# Patient Record
Sex: Female | Born: 1975 | Race: White | Hispanic: No | Marital: Married | Smoking: Never smoker
Health system: Southern US, Community
[De-identification: ages and names within clinical notes are randomized; demographics above are authoritative.]

## PROBLEM LIST (undated history)

## (undated) DIAGNOSIS — Z800 Family history of malignant neoplasm of digestive organs: Secondary | ICD-10-CM

## (undated) DIAGNOSIS — Z808 Family history of malignant neoplasm of other organs or systems: Secondary | ICD-10-CM

## (undated) DIAGNOSIS — K219 Gastro-esophageal reflux disease without esophagitis: Secondary | ICD-10-CM

## (undated) DIAGNOSIS — Z803 Family history of malignant neoplasm of breast: Secondary | ICD-10-CM

## (undated) HISTORY — DX: Family history of malignant neoplasm of other organs or systems: Z80.8

## (undated) HISTORY — PX: WISDOM TOOTH EXTRACTION: SHX21

## (undated) HISTORY — PX: TONSILLECTOMY: SUR1361

## (undated) HISTORY — DX: Gastro-esophageal reflux disease without esophagitis: K21.9

## (undated) HISTORY — DX: Family history of malignant neoplasm of breast: Z80.3

## (undated) HISTORY — DX: Family history of malignant neoplasm of digestive organs: Z80.0

---
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
## 2015-10-24 ENCOUNTER — Encounter (HOSPITAL_BASED_OUTPATIENT_CLINIC_OR_DEPARTMENT_OTHER): Payer: Self-pay | Admitting: *Deleted

## 2015-10-24 ENCOUNTER — Emergency Department (HOSPITAL_BASED_OUTPATIENT_CLINIC_OR_DEPARTMENT_OTHER)
Admission: EM | Admit: 2015-10-24 | Discharge: 2015-10-24 | Disposition: A | Discharge status: Home / Self Care | Payer: Managed Care, Other (non HMO) | Attending: Emergency Medicine | Admitting: Emergency Medicine

## 2015-10-24 ENCOUNTER — Emergency Department (HOSPITAL_BASED_OUTPATIENT_CLINIC_OR_DEPARTMENT_OTHER): Payer: Managed Care, Other (non HMO)

## 2015-10-24 DIAGNOSIS — Y998 Other external cause status: Secondary | ICD-10-CM | POA: Diagnosis not present

## 2015-10-24 DIAGNOSIS — Z792 Long term (current) use of antibiotics: Secondary | ICD-10-CM | POA: Insufficient documentation

## 2015-10-24 DIAGNOSIS — Y9289 Other specified places as the place of occurrence of the external cause: Secondary | ICD-10-CM | POA: Insufficient documentation

## 2015-10-24 DIAGNOSIS — Y9389 Activity, other specified: Secondary | ICD-10-CM | POA: Diagnosis not present

## 2015-10-24 DIAGNOSIS — S4992XA Unspecified injury of left shoulder and upper arm, initial encounter: Secondary | ICD-10-CM

## 2015-10-24 MED ORDER — HYDROCODONE-ACETAMINOPHEN 5-325 MG PO TABS: 1.0000 | ORAL_TABLET | Freq: Once | ORAL | Status: DC

## 2015-10-24 MED ORDER — HYDROCODONE-ACETAMINOPHEN 5-325 MG PO TABS
1.0000 | ORAL_TABLET | Freq: Once | ORAL | Status: AC
  Administered 2015-10-24: 1 via ORAL
  Filled 2015-10-24: qty 1

## 2015-10-24 MED ORDER — IBUPROFEN 600 MG PO TABS: 600.0000 mg | ORAL_TABLET | Freq: Four times a day (QID) | ORAL | Status: DC | PRN

## 2015-10-24 NOTE — Discharge Instructions (Signed)
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
## 2017-06-28 ENCOUNTER — Other Ambulatory Visit: Payer: Self-pay | Admitting: Internal Medicine

## 2017-06-28 DIAGNOSIS — Z1231 Encounter for screening mammogram for malignant neoplasm of breast: Secondary | ICD-10-CM

## 2017-06-30 ENCOUNTER — Other Ambulatory Visit: Payer: Self-pay | Admitting: Internal Medicine

## 2017-06-30 DIAGNOSIS — N63 Unspecified lump in unspecified breast: Secondary | ICD-10-CM

## 2017-07-05 ENCOUNTER — Ambulatory Visit
Admission: RE | Admit: 2017-07-05 | Discharge: 2017-07-05 | Disposition: A | Discharge status: Home / Self Care | Payer: Managed Care, Other (non HMO) | Source: Ambulatory Visit | Attending: Internal Medicine | Admitting: Internal Medicine

## 2017-07-05 DIAGNOSIS — N63 Unspecified lump in unspecified breast: Secondary | ICD-10-CM

## 2018-04-16 ENCOUNTER — Encounter: Payer: Self-pay | Admitting: Gastroenterology

## 2018-06-19 ENCOUNTER — Encounter

## 2018-06-19 ENCOUNTER — Ambulatory Visit (INDEPENDENT_AMBULATORY_CARE_PROVIDER_SITE_OTHER): Payer: 59 | Admitting: Gastroenterology

## 2018-06-19 ENCOUNTER — Encounter: Payer: Self-pay | Admitting: Gastroenterology

## 2018-06-19 ENCOUNTER — Encounter (INDEPENDENT_AMBULATORY_CARE_PROVIDER_SITE_OTHER): Payer: Self-pay

## 2018-06-19 VITALS — BP 108/76 | HR 82 | Ht 69.0 in | Wt 186.0 lb

## 2018-06-19 DIAGNOSIS — R109 Unspecified abdominal pain: Secondary | ICD-10-CM | POA: Diagnosis not present

## 2018-06-19 DIAGNOSIS — R140 Abdominal distension (gaseous): Secondary | ICD-10-CM

## 2018-06-19 DIAGNOSIS — K219 Gastro-esophageal reflux disease without esophagitis: Secondary | ICD-10-CM

## 2018-06-19 DIAGNOSIS — Z800 Family history of malignant neoplasm of digestive organs: Secondary | ICD-10-CM

## 2018-06-19 DIAGNOSIS — R1012 Left upper quadrant pain: Secondary | ICD-10-CM

## 2018-06-19 DIAGNOSIS — K582 Mixed irritable bowel syndrome: Secondary | ICD-10-CM

## 2018-06-19 DIAGNOSIS — K6389 Other specified diseases of intestine: Secondary | ICD-10-CM

## 2018-06-19 MED ORDER — RIFAXIMIN 550 MG PO TABS: 550.0000 mg | ORAL_TABLET | Freq: Three times a day (TID) | ORAL | 0 refills | Status: DC

## 2018-06-19 NOTE — Patient Instructions (Addendum)
It has been recommended to you by your physician that you have a(n) Endoscopy completed. Per your request, we did not schedule the procedure(s) today. Please contact our office at [phone] should you decide to have the procedure completed.  We are giving you samples of Glycate to take three times a day as needed  We will send in a script of Xifaxian to your pharmacy to take for 10 days   Lactose-Free Diet, Adult If you have lactose intolerance, you are not able to digest lactose. Lactose is a natural sugar found mainly in milk and milk products. You may need to avoid all foods and beverages that contain lactose. A lactose-free diet can help you do this. What do I need to know about this diet?  Do not consume foods, beverages, vitamins, minerals, or medicines with lactose. Read ingredients lists carefully.  Look for the words "lactose-free" on labels.  Use lactase enzyme drops or tablets as directed by your health care provider.  Use lactose-free milk or a milk alternative, such as soy milk, for drinking and cooking.  Make sure you get enough calcium and vitamin D in your diet. A lactose-free eating plan can be lacking in these important nutrients.  Take calcium and vitamin D supplements as directed by your health care provider. Talk to your provider about supplements if you are not able to get enough calcium and vitamin D from food. Which foods have lactose? Lactose is found in:  Milk and foods made from milk.  Yogurt.  Cheese.  Butter.  Margarine.  Sour cream.  Cream.  Whipped toppings and nondairy creamers.  Ice cream and other milk-based desserts.  Lactose is also found in foods or products made with milk or milk ingredients. To find out whether a food contains milk or a milk ingredient, look at the ingredients list. Avoid foods with the statement "May contain milk" and foods that contain:  Butter.  Cream.  Milk.  Milk solids.  Milk  powder.  Whey.  Curd.  Caseinate.  Lactose.  Lactalbumin.  Lactoglobulin.  What are some alternatives to milk and foods made with milk products?  Lactose-free milk.  Soy milk with added calcium and vitamin D.  Almond, coconut, or rice milk with added calcium and vitamin D. Note that these are low in protein.  Soy products, such as soy yogurt, soy cheese, soy ice cream, and soy-based sour cream. Which foods can I eat? Grains Breads and rolls made without milk, such as [place], [place], or [place] bread, bagels, pita, and [employer]. Corn tortillas, corn meal, grits, and polenta. Crackers without lactose or milk solids, such as soda crackers and graham crackers. Cooked or dry cereals without lactose or milk solids. Pasta, quinoa, couscous, barley, oats, bulgur, farro, rice, wild rice, or other grains prepared without milk or lactose. Plain popcorn. Vegetables Fresh, frozen, and canned vegetables without cheese, cream, or butter sauces. Fruits All fresh, canned, frozen, or dried fruits that are not processed with lactose. Meats and Other Protein Sources Plain beef, chicken, fish, [place], lamb, veal, pork, wild game, or ham. Kosher-prepared meat products. Strained or junior meats that do not contain milk. Eggs. Soy meat substitutes. Beans, lentils, and hummus. Tofu. Nuts and seeds. Peanut or other nut butters without lactose. Soups, casseroles, and mixed dishes without cheese, cream, or milk. Dairy Lactose-free milk. Soy, rice, or almond milk with added calcium and vitamin D. Soy cheese and yogurt. Beverages Carbonated drinks. Tea. Coffee, freeze-dried coffee, and some instant coffees. Fruit and vegetable  juices. Condiments Soy sauce. Carob powder. Olives. Gravy made with water. Baker's cocoa. Rosita Fire. Pure seasonings and spices. Ketchup. Mustard. Bouillon. Broth. Sweets and Desserts Water and fruit ices. Gelatin. Cookies, pies, or cakes made from allowed ingredients, such as angel food  cake. Pudding made with water or a milk substitute. Lactose-free tofu desserts. Soy, coconut milk, or rice-milk-based frozen desserts. Sugar. Honey. Jam, jelly, and marmalade. Molasses. Pure sugar candy. Dark chocolate without milk. Marshmallows. Fats and Oils Margarines and salad dressings that do not contain milk. Tomasa Blase. Vegetable oils. Shortening. Mayonnaise. Soy or coconut-based cream. The items listed above may not be a complete list of recommended foods or beverages. Contact your dietitian for more options. Which foods are not recommended? Grains Breads and rolls that contain milk. Toaster pastries. Muffins, biscuits, waffles, cornbread, and pancakes. These can be prepared at home, commercial, or from mixes. Sweet rolls, donuts, English muffins, fry bread, lefse, flour tortillas with lactose, or [place] toast made with milk or milk ingredients. Crackers that contain lactose. Corn curls. Cooked or dry cereals with lactose. Vegetables Creamed or breaded vegetables. Vegetables in a cheese or butter sauce or with lactose-containing margarines. Instant potatoes. [place] fries. Scalloped or au gratin potatoes. Fruits None. Meats and Other Protein Sources Scrambled eggs, omelets, and souffles that contain milk. Creamed or breaded meat, fish, chicken, or [place]. Sausage products, such as wieners and liver sausage. Cold cuts that contain milk solids. Cheese, cottage cheese, ricotta cheese, and cheese spreads. Lasagna and macaroni and cheese. Pizza. Peanut or other nut butters with added milk solids. Casseroles or mixed dishes containing milk or cheese. Dairy All dairy products, including milk, goat's milk, buttermilk, kefir, acidophilus milk, flavored milk, evaporated milk, condensed milk, dulce de Haring, eggnog, yogurt, cheese, and cheese spreads. Beverages Hot chocolate. Cocoa with lactose. Instant iced teas. Powdered fruit drinks. Smoothies made with milk or yogurt. Condiments Chewing gum that has  lactose. Cocoa that has lactose. Spice blends if they contain milk products. Artificial sweeteners that contain lactose. Nondairy creamers. Sweets and Desserts Ice cream, ice milk, gelato, sherbet, and frozen yogurt. Custard, pudding, and mousse. Cake, cream pies, cookies, and other desserts containing milk, cream, cream cheese, or milk chocolate. Pie crust made with milk-containing margarine or butter. Reduced-calorie desserts made with a sugar substitute that contains lactose. Toffee and butterscotch. Milk, white, or dark chocolate that contains milk. Fudge. Caramel. Fats and Oils Margarines and salad dressings that contain milk or cheese. Cream. Half and half. Cream cheese. Sour cream. Chip dips made with sour cream or yogurt. The items listed above may not be a complete list of foods and beverages to avoid. Contact your dietitian for more information. Am I getting enough calcium? Calcium is found in many foods that contain lactose and is important for bone health. The amount of calcium you need depends on your age:  Adults younger than 50 years: 1000 mg of calcium a day.  Adults older than 50 years: 1200 mg of calcium a day.  If you are not getting enough calcium, other calcium sources include:  Orange juice with calcium added. There are 300-350 mg of calcium in 1 cup of orange juice.  Sardines with edible bones. There are 325 mg of calcium in 3 oz of sardines.  Calcium-fortified soy milk. There are 300-400 mg of calcium in 1 cup of calcium-fortified soy milk.  Calcium-fortified rice or almond milk. There are 300 mg of calcium in 1 cup of calcium-fortified rice or almond milk.  Canned salmon with edible  bones. There are 180 mg of calcium in 3 oz of canned salmon with edible bones.  Calcium-fortified breakfast cereals. There are [phone] mg of calcium in calcium-fortified breakfast cereals.  Tofu set with calcium sulfate. There are 250 mg of calcium in  cup of tofu set with calcium  sulfate.  Spinach, cooked. There are 145 mg of calcium in  cup of cooked spinach.  Edamame, cooked. There are 130 mg of calcium in  cup of cooked edamame.  Collard greens, cooked. There are 125 mg of calcium in  cup of cooked collard greens.  Kale, frozen or cooked. There are 90 mg of calcium in  cup of cooked or frozen kale.  Almonds. There are 95 mg of calcium in  cup of almonds.  Broccoli, cooked. There are 60 mg of calcium in 1 cup of cooked broccoli.  This information is not intended to replace advice given to you by your health care provider. Make sure you discuss any questions you have with your health care provider. Document Released: 02/18/2002 Document Revised: 02/04/2016 Document Reviewed: 11/29/2013 Elsevier Interactive Patient Education  2018 [employer].

## 2018-06-19 NOTE — Progress Notes (Signed)
Beth Winters    [id number]    July 26, 1976  Primary Care Physician:Zimmermann, Donovan Kail  Referring Physician: No referring provider defined for this encounter.  Chief complaint: GERD, alternating constipation and diarrhea HPI: 42 year old female here for new patient visit with complaints of intermittent  bloating, abd cramping, feels like baby kicking. Severe gas pain. She has alternating constipation and diarrhea Constipation with no bowel movement for 3-4 days followed by diarrhea and then normal bowel movement for some time  Recently started taking Align 1 capsule daily in the past 2 months and feels symptoms are slightly better but continues to have significant abdominal bloating 1 to 2 hours postprandial associated with significant cramping and discomfort, worse in the left upper quadrant.  No dysphagia, odynophagia, loss of appetite, melena or blood per rectum  She has intermittent mild nausea but no vomiting  Heartburn intermittently, uses pepcid as needed, heartburn is worse at bedtime On some nights she wakes up chocking and coughing  Father Gastric cancer diagnosed at age 12, passed away at age 51 Paternal aunt with breast and colon cancer, passed away at age 55 Paternal uncle passed away with esophageal cancer at age 17   Outpatient Encounter Medications as of 06/19/2018  Medication Sig  . buPROPion (WELLBUTRIN XL) 300 MG 24 hr tablet Take 300 mg by mouth daily.  . cetirizine (ZYRTEC) 5 MG tablet Take 5 mg by mouth daily.  . famotidine (PEPCID AC) 10 MG chewable tablet Chew 10 mg by mouth 2 (two) times daily.  . Probiotic Product (PROBIOTIC DAILY PO) Take by mouth.  . TAYTULLA 1-20 MG-MCG(24) CAPS Take 1 capsule by mouth daily.  . [DISCONTINUED] HYDROcodone-acetaminophen (NORCO/VICODIN) 5-325 MG tablet Take 1 tablet by mouth once.  . [DISCONTINUED] ibuprofen (ADVIL,MOTRIN) 600 MG tablet Take 1 tablet (600 mg total) by mouth every 6 (six) hours as needed  for moderate pain.  . [DISCONTINUED] levofloxacin (LEVAQUIN) 750 MG tablet Take 750 mg by mouth daily.   No facility-administered encounter medications on file as of 06/19/2018.     Allergies as of 06/19/2018 - Review Complete 06/19/2018  Allergen Reaction Noted  . Pineapple  10/24/2015    History reviewed. No pertinent past medical history.  History reviewed. No pertinent surgical history.  Family History  Problem Relation Age of Onset  . Stomach cancer Father 78  . Breast cancer Paternal Aunt   . Colon cancer Paternal Aunt   . AAA (abdominal aortic aneurysm) Paternal Aunt   . Esophageal cancer Paternal Uncle     Social History   Socioeconomic History  . Marital status: Married    Spouse name: Not on file  . Number of children: Not on file  . Years of education: Not on file  . Highest education level: Not on file  Occupational History  . Not on file  Social Needs  . Financial resource strain: Not on file  . Food insecurity:    Worry: Not on file    Inability: Not on file  . Transportation needs:    Medical: Not on file    Non-medical: Not on file  Tobacco Use  . Smoking status: Never Smoker  . Smokeless tobacco: Never Used  Substance and Sexual Activity  . Alcohol use: Yes    Comment: occ  . Drug use: No  . Sexual activity: Yes    Partners: Male    Birth control/protection: Pill  Lifestyle  . Physical activity:  Days per week: Not on file    Minutes per session: Not on file  . Stress: Not on file  Relationships  . Social connections:    Talks on phone: Not on file    Gets together: Not on file    Attends religious service: Not on file    Active member of club or organization: Not on file    Attends meetings of clubs or organizations: Not on file    Relationship status: Not on file  . Intimate partner violence:    Fear of current or ex partner: Not on file    Emotionally abused: Not on file    Physically abused: Not on file    Forced sexual  activity: Not on file  Other Topics Concern  . Not on file  Social History Narrative  . Not on file      Review of systems: Review of Systems  Constitutional: Negative for fever and chills.  HENT: Positive for allergy, sinus trouble Eyes: Negative for blurred vision.  Respiratory: Negative for cough, shortness of breath and wheezing.   Cardiovascular: Negative for chest pain and palpitations.  Gastrointestinal: as per HPI Genitourinary: Negative for dysuria, urgency, frequency and hematuria.  Musculoskeletal: Positive for myalgias, back pain and joint pain.  Skin: Negative for itching and rash.  Neurological: Negative for dizziness, tremors, focal weakness, seizures and loss of consciousness.  Endo/Heme/Allergies: Negative for seasonal allergies.  Psychiatric/Behavioral: Negative for depression, suicidal ideas and hallucinations.  Positive for insomnia All other systems reviewed and are negative.   Physical Exam: Vitals:   06/19/18 1409  BP: 108/76  Pulse: 82   Body mass index is 27.47 kg/m. Gen:      No acute distress HEENT:  EOMI, sclera anicteric Neck:     No masses; no thyromegaly Lungs:    Clear to auscultation bilaterally; normal respiratory effort CV:         Regular rate and rhythm; no murmurs Abd:      + bowel sounds; soft, non-tender; no palpable masses, no distension Ext:    No edema; adequate peripheral perfusion Skin:      Warm and dry; no rash Neuro: alert and oriented x 3 Psych: normal mood and affect  Data Reviewed:  Reviewed labs, radiology imaging, old records and pertinent past GI work up   Assessment and Plan/Recommendations:  42 year old female with alternating diarrhea with constipation, abdominal bloating, left upper quadrant discomfort, intermittent heartburn and reflux symptoms.  GERD: Continue Pepcid at bedtime as needed Antireflux measures and lifestyle modification Left upper quadrant abdominal pain with family history of gastric  cancer in father at age 83 Scheduled for EGD with biopsies to exclude H. pylori, peptic ulcer disease. The risks and benefits as well as alternatives of endoscopic procedure(s) have been discussed and reviewed. All questions answered. The patient agrees to proceed.   Abdominal bloating and cramping Possible lactose intolerance We will do a trial of lactose-free diet for 1 week  Given the associated IBS with alternating diarrhea constipation concerning for small intestinal bacterial overgrowth.  Will empirically treat with rifaximin 550 mg 3 times daily for 10 days continues to have persistent symptoms despite lactose-free diet Stop probiotic  Use glycopyrrolate 1 tablet 1.5 mg up to 3 times daily as needed for symptom  She has family history of breast cancer, gastric cancer and colon cancer on paternal side.  Will consider referral to genetic counseling if patient is interested in pursuing.    Iona Beard ,  MD [phone]    CC: No ref. provider found

## 2018-06-20 ENCOUNTER — Encounter: Payer: Self-pay | Admitting: Gastroenterology

## 2018-06-22 ENCOUNTER — Telehealth: Payer: Self-pay | Admitting: *Deleted

## 2018-06-22 DIAGNOSIS — R140 Abdominal distension (gaseous): Secondary | ICD-10-CM

## 2018-06-22 DIAGNOSIS — R1012 Left upper quadrant pain: Secondary | ICD-10-CM

## 2018-06-22 DIAGNOSIS — R109 Unspecified abdominal pain: Secondary | ICD-10-CM

## 2018-06-22 NOTE — Telephone Encounter (Signed)
Patient scheduled and instructions mailed today

## 2018-07-12 ENCOUNTER — Encounter: Payer: Self-pay | Admitting: Gastroenterology

## 2018-07-20 ENCOUNTER — Encounter: Payer: Self-pay | Admitting: Gastroenterology

## 2018-07-20 ENCOUNTER — Ambulatory Visit (AMBULATORY_SURGERY_CENTER): Payer: 59 | Admitting: Gastroenterology

## 2018-07-20 VITALS — BP 133/73 | HR 72 | Temp 97.5°F | Resp 20 | Ht 69.0 in | Wt 186.0 lb

## 2018-07-20 DIAGNOSIS — K219 Gastro-esophageal reflux disease without esophagitis: Secondary | ICD-10-CM

## 2018-07-20 DIAGNOSIS — K2970 Gastritis, unspecified, without bleeding: Secondary | ICD-10-CM

## 2018-07-20 MED ORDER — SODIUM CHLORIDE 0.9 % IV SOLN: 500.0000 mL | Freq: Once | INTRAVENOUS | Status: AC

## 2018-07-20 MED ORDER — LIDOCAINE VISCOUS HCL 2 % MT SOLN: 5.0000 mL | Freq: Four times a day (QID) | OROMUCOSAL | 0 refills | Status: DC | PRN

## 2018-07-20 MED ORDER — OMEPRAZOLE 40 MG PO CPDR
40.0000 mg | DELAYED_RELEASE_CAPSULE | Freq: Two times a day (BID) | ORAL | 3 refills | Status: DC
Start: 2018-07-20 — End: 2018-11-01

## 2018-07-20 NOTE — Progress Notes (Signed)
Called to room to assist during endoscopic procedure.  Patient ID and intended procedure confirmed with present staff. Received instructions for my participation in the procedure from the performing physician.  

## 2018-07-20 NOTE — Patient Instructions (Signed)
YOU HAD AN ENDOSCOPIC PROCEDURE TODAY AT THE  ENDOSCOPY CENTER:   Refer to the procedure report that was given to you for any specific questions about what was found during the examination.  If the procedure report does not answer your questions, please call your gastroenterologist to clarify.  If you requested that your care partner not be given the details of your procedure findings, then the procedure report has been included in a sealed envelope for you to review at your convenience later.  YOU SHOULD EXPECT: Some feelings of bloating in the abdomen. Passage of more gas than usual.  Walking can help get rid of the air that was put into your GI tract during the procedure and reduce the bloating. If you had a lower endoscopy (such as a colonoscopy or flexible sigmoidoscopy) you may notice spotting of blood in your stool or on the toilet paper. If you underwent a bowel prep for your procedure, you may not have a normal bowel movement for a few days.  Please Note:  You might notice some irritation and congestion in your nose or some drainage.  This is from the oxygen used during your procedure.  There is no need for concern and it should clear up in a day or so.  SYMPTOMS TO REPORT IMMEDIATELY:   Following upper endoscopy (EGD)  Vomiting of blood or coffee ground material  New chest pain or pain under the shoulder blades  Painful or persistently difficult swallowing  New shortness of breath  Fever of 100F or higher  Black, tarry-looking stools  For urgent or emergent issues, a gastroenterologist can be reached at any hour by calling [phone].   DIET:  We do recommend a small meal at first, but then you may proceed to your regular diet. Eat small frequent meals. Drink plenty of fluids but you should avoid alcoholic beverages for 24 hours.  MEDICATIONS: Use Prilosec (omeprazole) 40 mg by mouth twice daily for 2 months followed by once daily. Use Viscous Lidocaine 2 % 5 cc every 6  hours as needed.  Please see handouts given to you by your recovery nurse.  FOLLOW UP: Return to see Dr. Lavon Paganini in her office in 2 months.  ACTIVITY:  You should plan to take it easy for the rest of today and you should NOT DRIVE or use heavy machinery until tomorrow (because of the sedation medicines used during the test).    FOLLOW UP: Our staff will call the number listed on your records the next business day following your procedure to check on you and address any questions or concerns that you may have regarding the information given to you following your procedure. If we do not reach you, we will leave a message.  However, if you are feeling well and you are not experiencing any problems, there is no need to return our call.  We will assume that you have returned to your regular daily activities without incident.  If any biopsies were taken you will be contacted by phone or by letter within the next 1-3 weeks.  Please call us at [phone] if you have not heard about the biopsies in 3 weeks.    SIGNATURES/CONFIDENTIALITY: You and/or your care partner have signed paperwork which will be entered into your electronic medical record.  These signatures attest to the fact that that the information above on your After Visit Summary has been reviewed and is understood.  Full responsibility of the confidentiality of this discharge information  lies with you and/or your care-partner.

## 2018-07-20 NOTE — Progress Notes (Signed)
Report given to PACU, vss 

## 2018-07-20 NOTE — Op Note (Signed)
[facility] Patient Name: Beth Winters Procedure Date: 07/20/2018 1:58 PM MRN: [id number] Endoscopist: Napoleon Form , MD Age: 42 Referring MD:  Date of Birth: 1975-09-29 Gender: Female Account #: [id number] Procedure:                Upper GI endoscopy Indications:              Epigastric abdominal pain, Dyspepsia Medicines:                Monitored Anesthesia Care Procedure:                Pre-Anesthesia Assessment:                           - Prior to the procedure, a History and Physical                            was performed, and patient medications and                            allergies were reviewed. The patient's tolerance of                            previous anesthesia was also reviewed. The risks                            and benefits of the procedure and the sedation                            options and risks were discussed with the patient.                            All questions were answered, and informed consent                            was obtained. Prior Anticoagulants: The patient has                            taken no previous anticoagulant or antiplatelet                            agents. ASA Grade Assessment: I - A normal, healthy                            patient. After reviewing the risks and benefits,                            the patient was deemed in satisfactory condition to                            undergo the procedure.                           After obtaining informed consent, the endoscope was  passed under direct vision. Throughout the                            procedure, the patient's blood pressure, pulse, and                            oxygen saturations were monitored continuously. The                            Endoscope was introduced through the mouth, and                            advanced to the second part of duodenum. The upper                            GI endoscopy was  accomplished without difficulty.                            The patient tolerated the procedure well. Scope In: Scope Out: Findings:                 Mucosal changes including longitudinal furrows,                            circumferential folds, congestion (edema) and crepe                            paper esophagus were found in the proximal                            esophagus and in the mid esophagus. Esophageal                            findings were graded using the Eosinophilic                            Esophagitis Endoscopic Reference Score (EoE-EREFS)                            as: Edema Grade 1 Present (decreased clarity or                            absence of vascular markings), Rings Grade 0 None                            (no ridges or rings seen), Exudates Grade 0 None                            (no white lesions seen), Furrows Grade 1 Present                            (vertical lines with or without visible depth) and  Stricture none (no stricture found). Biopsies were                            obtained from the proximal esophagus with cold                            forceps for histology of suspected eosinophilic                            esophagitis.                           The Z-line was regular and was found 36 cm from the                            incisors.                           LA Grade B (one or more mucosal breaks greater than                            5 mm, not extending between the tops of two mucosal                            folds) esophagitis was found 35 to 36 cm from the                            incisors.                           Retained fluid (~small volume) with pills was found                            in the gastric body.                           Patchy mild inflammation characterized by                            congestion (edema) and erythema was found in the                            stomach. Biopsies  were taken with a cold forceps                            for Helicobacter pylori testing.                           The examined duodenum was normal. Complications:            No immediate complications. Estimated Blood Loss:     Estimated blood loss was minimal. Impression:               - Esophageal mucosal changes suspicious for  eosinophilic esophagitis. Biopsied.                           - Z-line regular, 36 cm from the incisors.                           - LA Grade B reflux esophagitis.                           - Retained gastric fluid.                           - Gastritis. Biopsied.                           - Normal examined duodenum. Recommendation:           - Patient has a contact number available for                            emergencies. The signs and symptoms of potential                            delayed complications were discussed with the                            patient. Return to normal activities tomorrow.                            Written discharge instructions were provided to the                            patient.                           - Resume previous diet.                           - Continue present medications.                           - Use Prilosec (omeprazole) 40 mg PO BID for 2                            months followed by once daily.                           - Follow an antireflux regimen indefinitely.                           - Small frequent meals                           - Return to my office in 2 months. Napoleon Form, MD 07/20/2018 2:29:34 PM This report has been signed electronically.

## 2018-07-23 ENCOUNTER — Telehealth: Payer: Self-pay | Admitting: *Deleted

## 2018-07-23 NOTE — Telephone Encounter (Signed)
  Follow up Call-  Call back number 07/20/2018  Post procedure Call Back phone  # [phone]  Permission to leave phone message Yes  Some recent data might be hidden     Patient questions:  Do you have a fever, pain , or abdominal swelling? No. Pain Score  0 *  Have you tolerated food without any problems? Yes.    Have you been able to return to your normal activities? Yes.    Do you have any questions about your discharge instructions: Diet   No. Medications  No. Follow up visit  No.  Do you have questions or concerns about your Care? No.  Actions: * If pain score is 4 or above: No action needed, pain <4.

## 2018-07-25 ENCOUNTER — Encounter: Payer: Self-pay | Admitting: Gastroenterology

## 2018-08-01 ENCOUNTER — Other Ambulatory Visit: Payer: Self-pay | Admitting: Internal Medicine

## 2018-08-01 DIAGNOSIS — Z1231 Encounter for screening mammogram for malignant neoplasm of breast: Secondary | ICD-10-CM

## 2018-09-13 ENCOUNTER — Ambulatory Visit
Admission: RE | Admit: 2018-09-13 | Discharge: 2018-09-13 | Disposition: A | Discharge status: Home / Self Care | Payer: 59 | Source: Ambulatory Visit | Attending: Internal Medicine | Admitting: Internal Medicine

## 2018-09-13 DIAGNOSIS — Z1231 Encounter for screening mammogram for malignant neoplasm of breast: Secondary | ICD-10-CM

## 2018-10-22 ENCOUNTER — Encounter: Payer: Self-pay | Admitting: Gastroenterology

## 2018-11-01 ENCOUNTER — Telehealth: Payer: Self-pay | Admitting: Gastroenterology

## 2018-11-01 MED ORDER — OMEPRAZOLE 40 MG PO CPDR: 40.0000 mg | DELAYED_RELEASE_CAPSULE | Freq: Two times a day (BID) | ORAL | 3 refills | Status: DC

## 2018-11-01 NOTE — Telephone Encounter (Signed)
Omeprazole sent to [facility] as requested

## 2018-11-01 NOTE — Telephone Encounter (Signed)
Beth Winters from [facility] home delivery [address] [place] [address]  [phone]) she is needing a renewal for the mm for the omeprazole (PRILOSEC) 40 MG capsule [[id number]]

## 2019-04-26 ENCOUNTER — Other Ambulatory Visit: Payer: Self-pay

## 2019-04-26 MED ORDER — GLYCOPYRROLATE 1 MG PO TABS: 1.0000 mg | ORAL_TABLET | Freq: Three times a day (TID) | ORAL | 0 refills | Status: AC | PRN

## 2019-05-14 ENCOUNTER — Encounter: Payer: Self-pay | Admitting: Gastroenterology

## 2019-05-14 ENCOUNTER — Ambulatory Visit (INDEPENDENT_AMBULATORY_CARE_PROVIDER_SITE_OTHER): Payer: 59 | Admitting: Gastroenterology

## 2019-05-14 VITALS — BP 110/80 | HR 94 | Temp 98.8°F | Ht 69.0 in | Wt 198.0 lb

## 2019-05-14 DIAGNOSIS — K219 Gastro-esophageal reflux disease without esophagitis: Secondary | ICD-10-CM

## 2019-05-14 DIAGNOSIS — R140 Abdominal distension (gaseous): Secondary | ICD-10-CM

## 2019-05-14 DIAGNOSIS — K5909 Other constipation: Secondary | ICD-10-CM | POA: Diagnosis not present

## 2019-05-14 MED ORDER — PRILOSEC 40 MG PO CPDR: 40.0000 mg | DELAYED_RELEASE_CAPSULE | Freq: Every day | ORAL | 3 refills | Status: DC

## 2019-05-14 NOTE — Patient Instructions (Addendum)
We have sent Prilosec to your pharmacy  Take Benefiber 1 teaspoon twice daily  Take Miralax 1/2 capful twice daily  Dr Silverio Decamp recommends that you complete a bowel purge (to clean out your bowels). Please do the following: Purchase a bottle of Miralax over the counter as well as a box of 5 mg dulcolax tablets. Take 4 dulcolax tablets. Wait 1 hour. You will then drink 6-8 capfuls of Miralax mixed in an adequate amount of water/juice/gatorade (you may choose which of these liquids to drink) over the next 2-3 hours. You should expect results within 1 to 6 hours after completing the bowel purge.  I appreciate the  opportunity to care for you  Thank You   Harl Bowie , MD

## 2019-05-14 NOTE — Progress Notes (Addendum)
Beth Winters    [id number]    07/13/1976  Primary Care Physician:Zimmermann, Donovan KailMatt, PA-C  Referring Physician: Salli QuarryZimmermann, [name], PA-C [address] [place],  [place] [id number]   Chief complaint:  GERD, constipation, bloating  HPI: 43 year old female here for follow-up visit She has intermittent abdominal bloating associated with decreased appetite.  She is taking MiraLAX capful as needed and has on average 3-4 bowel movements per week.  Denies any melena or blood per rectum. She feels full even after a small meal.  Denies any weight loss She didn't tolerate generic Omeprazole, didn't seem to work well.  EGD 07/20/2018 LA grade B esophagitis. Esophageal biopsies negative for EOE Mild gastritis.     Outpatient Encounter Medications as of 05/14/2019  Medication Sig  . Ascorbic Acid (VITAMIN C PO) Take by mouth.  Marland Kitchen. buPROPion (WELLBUTRIN XL) 300 MG 24 hr tablet Take 300 mg by mouth daily.  . cetirizine (ZYRTEC) 5 MG tablet Take 5 mg by mouth daily.  . famotidine (PEPCID AC) 10 MG chewable tablet Chew 10 mg by mouth 2 (two) times daily.  Marland Kitchen. glycopyrrolate (ROBINUL) 1 MG tablet Take 1 tablet (1 mg total) by mouth 3 (three) times daily as needed (intestinal cramps and gas pain).  Marland Kitchen. lidocaine (XYLOCAINE) 2 % solution Use as directed 5 mLs in the mouth or throat every 6 (six) hours as needed for mouth pain.  Marland Kitchen. omeprazole (PRILOSEC) 40 MG capsule Take 1 capsule (40 mg total) by mouth 2 (two) times daily.  . Probiotic Product (PROBIOTIC DAILY PO) Take by mouth.  . rifaximin (XIFAXAN) 550 MG TABS tablet Take 1 tablet (550 mg total) by mouth 3 (three) times daily.  . TAYTULLA 1-20 MG-MCG(24) CAPS Take 1 capsule by mouth daily.  Marland Kitchen. VITAMIN D PO Take by mouth.   Facility-Administered Encounter Medications as of 05/14/2019  Medication  . 0.9 %  sodium chloride infusion    Allergies as of 05/14/2019 - Review Complete 07/20/2018  Allergen Reaction Noted  . Pineapple Nausea And  Vomiting 10/24/2015    Past Medical History:  Diagnosis Date  . GERD (gastroesophageal reflux disease)     Past Surgical History:  Procedure Laterality Date  . LYMPH NODE DISSECTION  1993   bx's done first-enlarged so they removed this.  . TONSILLECTOMY    . WISDOM TOOTH EXTRACTION      Family History  Problem Relation Age of Onset  . Stomach cancer Father [id number]  . Breast cancer Paternal Aunt   . Colon cancer Paternal Aunt   . AAA (abdominal aortic aneurysm) Paternal Aunt   . Esophageal cancer Paternal Uncle   . Hypertrophic cardiomyopathy Mother   . Hypertrophic cardiomyopathy Sister   . Hypertrophic cardiomyopathy Maternal Uncle   . Colon polyps Neg Hx     Social History   Socioeconomic History  . Marital status: Married    Spouse name: Not on file  . Number of children: Not on file  . Years of education: Not on file  . Highest education level: Not on file  Occupational History  . Not on file  Social Needs  . Financial resource strain: Not on file  . Food insecurity    Worry: Not on file    Inability: Not on file  . Transportation needs    Medical: Not on file    Non-medical: Not on file  Tobacco Use  . Smoking status: Never Smoker  . Smokeless tobacco: Never Used  Substance and Sexual Activity  . Alcohol use: Yes    Comment: 2-3 drinks per month per pt  . Drug use: No  . Sexual activity: Yes    Partners: Male    Birth control/protection: Pill  Lifestyle  . Physical activity    Days per week: Not on file    Minutes per session: Not on file  . Stress: Not on file  Relationships  . Social Herbalist on phone: Not on file    Gets together: Not on file    Attends religious service: Not on file    Active member of club or organization: Not on file    Attends meetings of clubs or organizations: Not on file    Relationship status: Not on file  . Intimate partner violence    Fear of current or ex partner: Not on file    Emotionally abused: Not  on file    Physically abused: Not on file    Forced sexual activity: Not on file  Other Topics Concern  . Not on file  Social History Narrative  . Not on file      Review of systems: Review of Systems  Constitutional: Negative for fever and chills.  HENT: Positive for sinus problem Eyes: Negative for blurred vision.  Respiratory: Negative for cough, shortness of breath and wheezing.   Cardiovascular: Negative for chest pain and palpitations.  Gastrointestinal: as per HPI Genitourinary: Negative for dysuria and hematuria.  Positive for incomplete evacuation, urgency and frequency Musculoskeletal: Negative for myalgias, back pain and joint pain.  Skin: Negative for itching and rash.  Neurological: Negative for dizziness, tremors, focal weakness, seizures and loss of consciousness.  Endo/Heme/Allergies: Positive for seasonal allergies.  Psychiatric/Behavioral: Negative for depression, suicidal ideas and hallucinations.  All other systems reviewed and are negative.   Physical Exam: Vitals:   05/14/19 0939  BP: 110/80  Pulse: 94  Temp: 98.8 F (37.1 C)   Body mass index is 29.24 kg/m. Gen:      No acute distress HEENT:  EOMI, sclera anicteric Neck:     No masses; no thyromegaly Lungs:    Clear to auscultation bilaterally; normal respiratory effort CV:         Regular rate and rhythm; no murmurs Abd:      + bowel sounds; soft, non-tender; no palpable masses, no distension Ext:    No edema; adequate peripheral perfusion Skin:      Warm and dry; no rash Neuro: alert and oriented x 3 Psych: normal mood and affect  Data Reviewed:  Reviewed labs, radiology imaging, old records and pertinent past GI work up   Assessment and Plan/Recommendations:  43 year old female with history of GERD, abdominal bloating, fullness, decreased appetite and worsening constipation  Prilosec 40 mg twice daily Discussed antireflux measures and lifestyle modification in detail  Change in  bowel habits with worsening constipation Plan for for bowel purge with MiraLAX Start Benefiber 1 teaspoon twice daily with meals MiraLAX half capful twice daily, titrate based on response with goal 1-2 soft bowel movements daily If continues to have persistent constipation, will consider colonoscopy for further evaluation +/-anorectal manometry to exclude dyssynergic defecation Return in 3 months or sooner if needed  25 minutes was spent face-to-face with the patient. Greater than 50% of the time used for counseling as well as treatment plan and follow-up. She had multiple questions which were answered to her satisfaction  K. Denzil Magnuson , MD    CC:  Salli Quarry, PA-C

## 2019-05-24 ENCOUNTER — Encounter: Payer: Self-pay | Admitting: Gastroenterology

## 2019-05-24 ENCOUNTER — Other Ambulatory Visit: Payer: Self-pay

## 2019-05-24 MED ORDER — OMEPRAZOLE 20 MG PO CPDR: 40.0000 mg | DELAYED_RELEASE_CAPSULE | Freq: Two times a day (BID) | ORAL | 3 refills | Status: DC

## 2019-09-25 ENCOUNTER — Other Ambulatory Visit: Payer: Self-pay | Admitting: Internal Medicine

## 2019-09-25 DIAGNOSIS — Z1231 Encounter for screening mammogram for malignant neoplasm of breast: Secondary | ICD-10-CM

## 2019-11-05 ENCOUNTER — Other Ambulatory Visit: Payer: Self-pay

## 2019-11-05 ENCOUNTER — Ambulatory Visit
Admission: RE | Admit: 2019-11-05 | Discharge: 2019-11-05 | Disposition: A | Discharge status: Home / Self Care | Payer: Managed Care, Other (non HMO) | Source: Ambulatory Visit | Attending: Internal Medicine | Admitting: Internal Medicine

## 2019-11-05 DIAGNOSIS — Z1231 Encounter for screening mammogram for malignant neoplasm of breast: Secondary | ICD-10-CM

## 2020-04-03 ENCOUNTER — Other Ambulatory Visit: Payer: Self-pay | Admitting: Internal Medicine

## 2020-04-03 DIAGNOSIS — N182 Chronic kidney disease, stage 2 (mild): Secondary | ICD-10-CM

## 2020-04-09 ENCOUNTER — Ambulatory Visit
Admission: RE | Admit: 2020-04-09 | Discharge: 2020-04-09 | Disposition: A | Discharge status: Home / Self Care | Payer: 59 | Source: Ambulatory Visit | Attending: Internal Medicine | Admitting: Internal Medicine

## 2020-04-09 DIAGNOSIS — N182 Chronic kidney disease, stage 2 (mild): Secondary | ICD-10-CM

## 2020-05-18 ENCOUNTER — Other Ambulatory Visit: Payer: Self-pay | Admitting: Gastroenterology

## 2020-09-14 ENCOUNTER — Telehealth: Payer: Self-pay | Admitting: Genetic Counselor

## 2020-09-14 NOTE — Telephone Encounter (Signed)
Received a genetic counseling referral from Dr. Loreta Ave family history of malignant neoplasm of digestive organs. I returned Beth Winters's call and scheduled her for a mychart video visit on 1/20 at 9am. Pt has an active mychart acct and aware to log in 10 minutes prior to ensure her camera and audio works.

## 2020-10-01 ENCOUNTER — Inpatient Hospital Stay: Payer: 59 | Attending: Genetic Counselor | Admitting: Licensed Clinical Social Worker

## 2020-10-01 ENCOUNTER — Encounter: Payer: Self-pay | Admitting: Licensed Clinical Social Worker

## 2020-10-01 DIAGNOSIS — Z808 Family history of malignant neoplasm of other organs or systems: Secondary | ICD-10-CM | POA: Insufficient documentation

## 2020-10-01 DIAGNOSIS — Z803 Family history of malignant neoplasm of breast: Secondary | ICD-10-CM | POA: Diagnosis not present

## 2020-10-01 DIAGNOSIS — Z800 Family history of malignant neoplasm of digestive organs: Secondary | ICD-10-CM | POA: Insufficient documentation

## 2020-10-01 NOTE — Progress Notes (Signed)
REFERRING PROVIDER: Charna Elizabeth, MD [address] [place],  [place] [address]   PRIMARY PROVIDER:  Salli Quarry, PA-C  PRIMARY REASON FOR VISIT:  1. Family history of stomach cancer   2. Family history of breast cancer   3. Family history of colon cancer   4. Family history of adrenal cancer    I connected with Beth Winters on 10/01/2020 at 8:55 AM EDT by MyChart video conference and verified that I am speaking with the correct person using two identifiers.    Patient location: home Provider location: [facility]   HISTORY OF PRESENT ILLNESS:   [name], a 45 y.o. female, was seen for a [place] cancer genetics consultation at the request of Dr.Mann due to a family history of cancer  Beth Winters presents to clinic today to discuss the possibility of a hereditary predisposition to cancer, genetic testing, and to further clarify her future cancer risks, as well as potential cancer risks for family members.   Beth Winters is a 45 y.o. female with no personal history of cancer.    CANCER HISTORY:  Oncology History   No history exists.     RISK FACTORS:  Menarche was at age 28.  First live birth at age 36.  OCP use for approximately 15 years.  Ovaries intact: yes.  Hysterectomy: no.  Menopausal status: premenopausal.  HRT use: 0 years. Colonoscopy: no; not examined. Mammogram within the last year: yes. Number of breast biopsies: 0. Up to date with pelvic exams: yes.   Past Medical History:  Diagnosis Date  . Family history of adrenal cancer   . Family history of breast cancer   . Family history of colon cancer   . Family history of stomach cancer   . GERD (gastroesophageal reflux disease)     Past Surgical History:  Procedure Laterality Date  . LYMPH NODE DISSECTION  1993   bx's done first-enlarged so they removed this.  . TONSILLECTOMY    . WISDOM TOOTH EXTRACTION      Social History   Socioeconomic History  . Marital status: Married     Spouse name: Not on file  . Number of children: Not on file  . Years of education: Not on file  . Highest education level: Not on file  Occupational History  . Not on file  Tobacco Use  . Smoking status: Never Smoker  . Smokeless tobacco: Never Used  Vaping Use  . Vaping Use: Never used  Substance and Sexual Activity  . Alcohol use: Yes    Comment: 2-3 drinks per month per pt  . Drug use: No  . Sexual activity: Yes    Partners: Male    Birth control/protection: Pill  Other Topics Concern  . Not on file  Social History Narrative  . Not on file   Social Determinants of Health   Financial Resource Strain: Not on file  Food Insecurity: Not on file  Transportation Needs: Not on file  Physical Activity: Not on file  Stress: Not on file  Social Connections: Not on file     FAMILY HISTORY:  We obtained a detailed, 4-generation family history.  Significant diagnoses are listed below: Family History  Problem Relation Age of Onset  . Stomach cancer Father 2  . Breast cancer Paternal Aunt 78  . Colon cancer Paternal Aunt 64  . AAA (abdominal aortic aneurysm) Paternal Aunt   . Other Paternal Aunt 38       adrenal  cancer  . Esophageal cancer Paternal Uncle        or lung cancer  . Hypertrophic cardiomyopathy Mother   . Hypertrophic cardiomyopathy Sister   . Hypertrophic cardiomyopathy Maternal Uncle   . Colon polyps Neg Hx    Beth Winters has 1 full brother and sister, they are twins. She has two maternal half sisters and 1 paternal half brother, none have had cancer. She has 2 daughters ages 65 and 76.  Beth Winters mother died at 42, no history of cancer. Patient had 2 maternal uncles and 1 aunt, her aunt had some skin cancers removed. No known cancers in maternal cousins. Maternal grandmother died at 14, grandfather died at 36. There is a history of hypertrophic cardiomyopathy on this side of the family that patient's sister is having genetics work up for.   Beth Winters father  died at 29. He had stomach cancer diagnosed at 31 and then liver cancer at 28. Beth Winters had 2 paternal uncles and 1 aunt. One uncle had either esophageal or lung or throat cancer, he did have history of smoking. Her aunt had breast and colon diagnosed in her early 59s and adrenal cancer shortly after as well. No known cancers in paternal cousins. Paternal grandmother died in her 50s/60s of heart issues, grandfather died in his 24s.   Beth Winters is unaware of previous family history of genetic testing for hereditary cancer risks.     GENETIC COUNSELING ASSESSMENT: Beth Winters is a 45 y.o. female with a family history of stomach/colon cancer which is somewhat suggestive of a hereditary cancer syndrome and predisposition to cancer. We, therefore, discussed and recommended the following at today's visit.   DISCUSSION: We discussed that approximately 5-10% of cancer is hereditary.  While she does not quite meet criteria for genetic testing, her family history is somewhat suggestive of Lynch syndrome which we discussed in detail. We discussed that testing is beneficial for several reasons including  knowing about other cancer risks, identifying potential screening and risk-reduction options that may be appropriate, and to understand if other family members could be at risk for cancer and allow them to undergo genetic testing.   We reviewed the characteristics, features and inheritance patterns of hereditary cancer syndromes. We also discussed genetic testing, including the appropriate family members to test, the process of testing, insurance coverage and turn-around-time for results. We discussed the implications of a negative, positive and/or variant of uncertain significant result. We recommended Beth Winters pursue genetic testing for the [facility] CancerNext-Expanded gene panel.   The CancerNext-Expanded + RNAinsight gene panel offered by [employer] and includes sequencing and rearrangement analysis for the  following 77 genes: IP, ALK, APC*, ATM*, AXIN2, BAP1, BARD1, BLM, BMPR1A, BRCA1*, BRCA2*, BRIP1*, CDC73, CDH1*,CDK4, CDKN1B, CDKN2A, CHEK2*, CTNNA1, DICER1, FANCC, FH, FLCN, GALNT12, KIF1B, LZTR1, MAX, MEN1, MET, MLH1*, MSH2*, MSH3, MSH6*, MUTYH*, NBN, NF1*, NF2, NTHL1, PALB2*, PHOX2B, PMS2*, POT1, PRKAR1A, PTCH1, PTEN*, RAD51C*, RAD51D*,RB1, RECQL, RET, SDHA, SDHAF2, SDHB, SDHC, SDHD, SMAD4, SMARCA4, SMARCB1, SMARCE1, STK11, SUFU, TMEM127, TP53*,TSC1, TSC2, VHL and XRCC2 (sequencing and deletion/duplication); EGFR, EGLN1, HOXB13, KIT, MITF, PDGFRA, POLD1 and POLE (sequencing only); EPCAM and GREM1 (deletion/duplication only).   Based on Beth Winters's family history of cancer, she does not meet medical criteria for genetic testing and she will pay the $250 self-pay price.   PLAN: After considering the risks, benefits, and limitations, Beth Winters provided informed consent to pursue genetic testing. A saliva kit was mailed to her and the sample will be  sent to [facility] for analysis of the CancerNext-Expanded panel. Results should be available within approximately 2-3 weeks' time, at which point they will be disclosed by telephone to Beth Winters, as will any additional recommendations warranted by these results. Beth Winters will receive a summary of her genetic counseling visit and a copy of her results once available. This information will also be available in Epic.   Beth Winters questions were answered to her satisfaction today. Our contact information was provided should additional questions or concerns arise. Thank you for the referral and allowing [place] to share in the care of your patient.   Faith Rogue, MS, [facility] Genetic Counselor [email or web address] Phone: [phone]  The patient was seen for a total of 30 minutes in virtual genetic counseling.  Dr. Grayland Ormond was available for discussion regarding this case.   _______________________________________________________________________ For  Office Staff:  Number of people involved in session: 1 Was an Intern/ student involved with case: no

## 2020-11-09 ENCOUNTER — Telehealth: Payer: Self-pay | Admitting: Licensed Clinical Social Worker

## 2020-11-16 ENCOUNTER — Ambulatory Visit: Payer: Self-pay | Admitting: Licensed Clinical Social Worker

## 2020-11-16 NOTE — Progress Notes (Signed)
error 

## 2020-11-26 NOTE — Telephone Encounter (Signed)
Attempted to contact [facility] with genetic test results by phone and by email with no response.

## 2020-12-01 ENCOUNTER — Ambulatory Visit: Payer: Self-pay | Admitting: Licensed Clinical Social Worker

## 2020-12-01 ENCOUNTER — Encounter: Payer: Self-pay | Admitting: Licensed Clinical Social Worker

## 2020-12-01 DIAGNOSIS — Z800 Family history of malignant neoplasm of digestive organs: Secondary | ICD-10-CM

## 2020-12-01 DIAGNOSIS — Z1379 Encounter for other screening for genetic and chromosomal anomalies: Secondary | ICD-10-CM

## 2020-12-01 DIAGNOSIS — Z808 Family history of malignant neoplasm of other organs or systems: Secondary | ICD-10-CM

## 2020-12-01 DIAGNOSIS — Z803 Family history of malignant neoplasm of breast: Secondary | ICD-10-CM

## 2020-12-01 NOTE — Progress Notes (Signed)
HPI:  [name] was previously seen in the [facility] due to a family history of cancer and concerns regarding a hereditary predisposition to cancer. Please refer to our prior cancer genetics clinic note for more information regarding our discussion, assessment and recommendations, at the time. [name] recent genetic test results were disclosed to her, as were recommendations warranted by these results. These results and recommendations are discussed in more detail below.  CANCER HISTORY:  Oncology History   No history exists.    FAMILY HISTORY:  We obtained a detailed, 4-generation family history.  Significant diagnoses are listed below: Family History  Problem Relation Age of Onset  . Stomach cancer Father 75  . Breast cancer Paternal Aunt 32  . Colon cancer Paternal Aunt 52  . AAA (abdominal aortic aneurysm) Paternal Aunt   . Other Paternal Aunt 31       adrenal cancer  . Esophageal cancer Paternal Uncle        or lung cancer  . Hypertrophic cardiomyopathy Mother   . Hypertrophic cardiomyopathy Sister   . Hypertrophic cardiomyopathy Maternal Uncle   . Colon polyps Neg Hx    [name] has 1 full brother and sister, they are twins. She has two maternal half sisters and 1 paternal half brother, none have had cancer. She has 2 daughters ages 74 and 57.  [name] mother died at 48, no history of cancer. Patient had 2 maternal uncles and 1 aunt, her aunt had some skin cancers removed. No known cancers in maternal cousins. Maternal grandmother died at 74, grandfather died at 22. There is a history of hypertrophic cardiomyopathy on this side of the family that patient's sister is having genetics work up for.   [name] father died at 59. He had stomach cancer diagnosed at 52 and then liver cancer at 45. [name] had 2 paternal uncles and 1 aunt. One uncle had either esophageal or lung or throat cancer, he did have history of smoking. Her aunt had breast and colon  diagnosed in her early 35s and adrenal cancer shortly after as well. No known cancers in paternal cousins. Paternal grandmother died in her 50s/60s of heart issues, grandfather died in his 74s.   [name] is unaware of previous family history of genetic testing for hereditary cancer risks.      GENETIC TEST RESULTS: Genetic testing reported out on 11/03/2020 through the [facility]Exp[facility] cancer panel found no pathogenic mutations.    The test report has been scanned into EPIC and is located under the Molecular Pathology section of the Results Review tab.  A portion of the result report is included below for reference.     We discussed with [name] that because current genetic testing is not perfect, it is possible there may be a gene mutation in one of these genes that current testing cannot detect, but that chance is small.  We also discussed, that there could be another gene that has not yet been discovered, or that we have not yet tested, that is responsible for the cancer diagnoses in the family. It is also possible there is a hereditary cause for the cancer in the family that [name] did not inherit and therefore was not identified in her testing.  Therefore, it is important to remain in touch with cancer genetics in the future so that we can continue to offer [name] the most up to date genetic testing.    ADDITIONAL GENETIC  TESTING: We discussed with [name] that her genetic testing was fairly extensive.  If there are genes identified to increase cancer risk that can be analyzed in the future, we would be happy to discuss and coordinate this testing at that time.    CANCER SCREENING RECOMMENDATIONS: [name] test result is considered negative (normal).  This means that we have not identified a hereditary cause for her  family history of cancer at this time.   While reassuring, this does not definitively rule out a hereditary predisposition to cancer. It is still possible  that there could be genetic mutations that are undetectable by current technology. There could be genetic mutations in genes that have not been tested or identified to increase cancer risk.  Therefore, it is recommended she continue to follow the cancer management and screening guidelines provided by her  primary healthcare provider.   An individual's cancer risk and medical management are not determined by genetic test results alone. Overall cancer risk assessment incorporates additional factors, including personal medical history, family history, and any available genetic information that may result in a personalized plan for cancer prevention and surveillance.  Based on [name]'s personal and family history of cancer as well as her genetic test results, risk model Rockne Beth Winters was used to estimate her risk of developing breast cancer. This estimates her lifetime risk of developing breast cancer to be approximately 27%.  The patient's lifetime breast cancer risk is a preliminary estimate based on available information using one of several models endorsed by the American Cancer Society (ACS). The ACS recommends consideration of breast MRI screening as an adjunct to mammography for patients at high risk (defined as 20% or greater lifetime risk).      [name] has been determined to be at high risk for breast cancer.  We, therefore, discussed that it is reasonable for [name] to be followed by a high-risk breast cancer clinic; in addition to a yearly mammogram and physical exam by a healthcare provider, she should discuss the usefulness of an annual breast MRI with the high-risk clinic providers. A referral will be placed to the high risk breast clinic.  RECOMMENDATIONS FOR FAMILY MEMBERS:  Relatives in this family might be at some increased risk of developing cancer, over the general population risk, simply due to the family history of cancer.  We recommended female relatives in this family have a yearly  mammogram beginning at age 72, or 31 years younger than the earliest onset of cancer, an annual clinical breast exam, and perform monthly breast self-exams. Female relatives in this family should also have a gynecological exam as recommended by their primary provider.  All family members should be referred for colonoscopy starting at age 76.   FOLLOW-UP: Lastly, we discussed with Ms. Addison that cancer genetics is a rapidly advancing field and it is possible that new genetic tests will be appropriate for her and/or her family members in the future. We encouraged her to remain in contact with cancer genetics on an annual basis so we can update her personal and family histories and let her know of advances in cancer genetics that may benefit this family.   Our contact number was provided. [name] questions were answered to her satisfaction, and she knows she is welcome to call us at anytime with additional questions or concerns.   Beth Duverney, MS, [facility] Genetic Counselor [email or web address] Phone: [phone]

## 2020-12-03 ENCOUNTER — Telehealth: Payer: Self-pay | Admitting: Hematology and Oncology

## 2020-12-03 NOTE — Telephone Encounter (Signed)
I returned Ms. Chuong's call to schedule her for the high risk clinic w/Dr. Al Pimple on 3/30 at 140pm. Pt aware to arrive 20 minutes early.

## 2020-12-09 ENCOUNTER — Other Ambulatory Visit: Payer: Self-pay

## 2020-12-09 ENCOUNTER — Encounter: Payer: Self-pay | Admitting: Hematology and Oncology

## 2020-12-09 ENCOUNTER — Inpatient Hospital Stay: Payer: 59 | Attending: Hematology and Oncology | Admitting: Hematology and Oncology

## 2020-12-09 VITALS — BP 124/71 | HR 79 | Temp 97.9°F | Resp 14 | Ht 69.0 in | Wt 193.9 lb

## 2020-12-09 DIAGNOSIS — Z808 Family history of malignant neoplasm of other organs or systems: Secondary | ICD-10-CM | POA: Diagnosis not present

## 2020-12-09 DIAGNOSIS — Z800 Family history of malignant neoplasm of digestive organs: Secondary | ICD-10-CM

## 2020-12-09 DIAGNOSIS — Z9189 Other specified personal risk factors, not elsewhere classified: Secondary | ICD-10-CM

## 2020-12-09 DIAGNOSIS — N926 Irregular menstruation, unspecified: Secondary | ICD-10-CM | POA: Diagnosis present

## 2020-12-09 DIAGNOSIS — Z803 Family history of malignant neoplasm of breast: Secondary | ICD-10-CM

## 2020-12-09 DIAGNOSIS — Z793 Long term (current) use of hormonal contraceptives: Secondary | ICD-10-CM | POA: Diagnosis present

## 2020-12-09 DIAGNOSIS — Z1379 Encounter for other screening for genetic and chromosomal anomalies: Secondary | ICD-10-CM

## 2020-12-09 NOTE — Progress Notes (Signed)
Cancer Center CONSULT NOTE  Patient Care Team: Salli Quarry, PA-C as PCP - General (Physician Assistant)  CHIEF COMPLAINTS/PURPOSE OF CONSULTATION:  Family history of breast cancer.  ASSESSMENT & PLAN:  No problem-specific Assessment & Plan notes found for this encounter.  Orders Placed This Encounter  Procedures  . Ambulatory referral to Social Work    Referral Priority:   Routine    Referral Type:   Consultation    Referral Reason:   Specialty Services Required    Number of Visits Requested:   1   1. High risk for breast cancer. Patient recently saw our genetic counselor given her family and was found to have life time risk of BC per TC model to be around 26%, hence referred to [place]. She is doing really well, no concerns. PE unremarkable, no findings on breast exam or regional adenopathy. Given life time risk of BC greater than 20 % based on TC model, we have discussed MRI alternating with mammograms for breast cancer screening. We have discussed that long term risk of gadolinium deposition from repeated MRI is not clear at this time. MRI's also can lead to increased biopsies. She is agreeable, screening mammogram ordered for this visit.  Per Dondra Spry model, 5 yr risk of breast cancer is 0.7%, hence she doesn't qualify for tamoxifen prevention. We have discussed about life style modification including weight control, regular exercise, healthy diet choices and limiting alcohol. We discussed there is a slight increased risk of breast cancer with birth control. She expressed understanding of all the recommendations. She will return to clinic in 6 months for repeat breast exam.  HISTORY OF PRESENTING ILLNESS:   Beth Winters 45 y.o. female is here because for evaluation by High risk breast clinic.  This is a very pleasant 45 year old female patient with no significant past medical history except for family history of breast cancer referred to high risk breast cancer  clinic given her lifetime risk of breast cancer. Ms. Wieseler arrived to the appointment today by herself.  She feels well and has no ongoing health complaints.  She does take birth control for menstrual cycle regulation and has been taking it for over 5 years.  She does mention family history of breast cancer, colon cancer and adrenal cancer in her maternal aunt. Otherwise no family history of breast cancer or ovarian cancer.  She does mention that she has very dense breasts and this has been reported on her mammograms. Rest of the pertinent 10 point ROS reviewed and negative.  REVIEW OF SYSTEMS:   Constitutional: Denies fevers, chills or abnormal night sweats Eyes: Denies blurriness of vision, double vision or watery eyes Ears, nose, mouth, throat, and face: Denies mucositis or sore throat Respiratory: Denies cough, dyspnea or wheezes Cardiovascular: Denies palpitation, chest discomfort or lower extremity swelling Gastrointestinal:  Denies nausea, heartburn or change in bowel habits Skin: Denies abnormal skin rashes Lymphatics: Denies new lymphadenopathy or easy bruising Neurological:Denies numbness, tingling or new weaknesses Behavioral/Psych: Mood is stable, no new changes  All other systems were reviewed with the patient and are negative.  MEDICAL HISTORY:  Past Medical History:  Diagnosis Date  . Family history of adrenal cancer   . Family history of breast cancer   . Family history of colon cancer   . Family history of stomach cancer   . GERD (gastroesophageal reflux disease)     SURGICAL HISTORY: Past Surgical History:  Procedure Laterality Date  . LYMPH NODE DISSECTION  1993  bx's done first-enlarged so they removed this.  . TONSILLECTOMY    . WISDOM TOOTH EXTRACTION      SOCIAL HISTORY: Social History   Socioeconomic History  . Marital status: Married    Spouse name: Not on file  . Number of children: Not on file  . Years of education: Not on file  . Highest  education level: Not on file  Occupational History  . Not on file  Tobacco Use  . Smoking status: Never Smoker  . Smokeless tobacco: Never Used  Vaping Use  . Vaping Use: Never used  Substance and Sexual Activity  . Alcohol use: Yes    Comment: 2-3 drinks per month per pt  . Drug use: No  . Sexual activity: Yes    Partners: Male    Birth control/protection: Pill  Other Topics Concern  . Not on file  Social History Narrative  . Not on file   Social Determinants of Health   Financial Resource Strain: Not on file  Food Insecurity: Not on file  Transportation Needs: Not on file  Physical Activity: Not on file  Stress: Not on file  Social Connections: Not on file  Intimate Partner Violence: Not on file    FAMILY HISTORY: Family History  Problem Relation Age of Onset  . Stomach cancer Father 60  . Breast cancer Paternal Aunt 37  . Colon cancer Paternal Aunt 65  . AAA (abdominal aortic aneurysm) Paternal Aunt   . Other Paternal Aunt 45       adrenal cancer  . Esophageal cancer Paternal Uncle        or lung cancer  . Hypertrophic cardiomyopathy Mother   . Hypertrophic cardiomyopathy Sister   . Hypertrophic cardiomyopathy Maternal Uncle   . Colon polyps Neg Hx     ALLERGIES:  is allergic to pineapple.  MEDICATIONS:  Current Outpatient Medications  Medication Sig Dispense Refill  . Ascorbic Acid (VITAMIN C PO) Take by mouth.    Marland Kitchen buPROPion (WELLBUTRIN XL) 300 MG 24 hr tablet Take 300 mg by mouth daily.    . cetirizine (ZYRTEC) 5 MG tablet Take 5 mg by mouth daily.    Marland Kitchen glycopyrrolate (ROBINUL) 1 MG tablet Take 1 tablet (1 mg total) by mouth 3 (three) times daily as needed (intestinal cramps and gas pain). 90 tablet 0  . omeprazole (PRILOSEC) 40 MG capsule TAKE 1 CAPSULE BY MOUTH EVERY DAY 90 capsule 3  . TRI-SPRINTEC 0.18/0.215/0.25 MG-35 MCG tablet Take 1 tablet by mouth daily.    Marland Kitchen VITAMIN D PO Take by mouth.    Marland Kitchen omeprazole (PRILOSEC) 20 MG capsule Take 2 capsules  (40 mg total) by mouth 2 (two) times daily before a meal. 120 capsule 3   Current Facility-Administered Medications  Medication Dose Route Frequency Provider Last Rate Last Admin  . 0.9 %  sodium chloride infusion  500 mL Intravenous Once Nandigam, Eleonore Chiquito, MD         PHYSICAL EXAMINATION:  ECOG PERFORMANCE STATUS: 0 - Asymptomatic  Vitals:   12/09/20 1351  BP: 124/71  Pulse: 79  Resp: 14  Temp: 97.9 F (36.6 C)  SpO2: 100%   Filed Weights   12/09/20 1351  Weight: 193 lb 14.4 oz (88 kg)    GENERAL:alert, no distress and comfortable NECK: supple, thyroid normal size, non-tender, without nodularity LYMPH:  no palpable lymphadenopathy in the cervical, axillary or inguinal ABDOMEN:abdomen soft, non-tender and normal bowel sounds Musculoskeletal:no cyanosis of digits and no clubbing  PSYCH: alert & oriented x 3 with fluent speech NEURO: no focal motor/sensory deficits Breast exam: Bilateral breasts inspected.  No definitive palpable masses or regional adenopathy.  No nipple changes noted  LABORATORY DATA:  I have reviewed the data as listed No results found for: WBC, HGB, HCT, MCV, PLT   Chemistry   No results found for: NA, K, CL, CO2, BUN, CREATININE, GLU No results found for: CALCIUM, ALKPHOS, AST, ALT, BILITOT     RADIOGRAPHIC STUDIES: I have personally reviewed the radiological images as listed and agreed with the findings in the report. No results found.  All questions were answered. The patient knows to call the clinic with any problems, questions or concerns. I spent 45 minutes in the care of this patient including H and P, review of records, counseling and coordination of care.     Rachel Moulds, MD 12/09/2020 2:43 PM

## 2020-12-10 ENCOUNTER — Encounter: Payer: Self-pay | Admitting: General Practice

## 2020-12-10 ENCOUNTER — Other Ambulatory Visit: Payer: Self-pay | Admitting: Hematology and Oncology

## 2020-12-10 DIAGNOSIS — Z9189 Other specified personal risk factors, not elsewhere classified: Secondary | ICD-10-CM

## 2020-12-10 NOTE — Progress Notes (Signed)
[facility] Psychosocial Distress Screening Clinical Social Work  Clinical Social Work was referred by distress screening protocol.  The patient scored a 5 on the Psychosocial Distress Thermometer which indicates moderate distress. Clinical Social Worker contacted patient by phone to assess for distress and other psychosocial needs. She was seen at high risk clinic, does not have a cancer diagnosis. She will be observed by oncologist.  Described patient and family support services available to those diagnosed with cancer and encouraged her to contact us with any needs.    ONCBCN DISTRESS SCREENING 12/09/2020  Screening Type Initial Screening  Distress experienced in past week (1-10) 5  Practical problem type Work/school  Referral to clinical social work Yes   Clinical Social Worker follow up needed: No.  If yes, follow up plan:  Sallee Lange, LCSW   Santa Genera, LCSW Clinical Social Worker Phone:  [phone]

## 2020-12-11 ENCOUNTER — Other Ambulatory Visit: Payer: Self-pay | Admitting: Hematology and Oncology

## 2020-12-11 DIAGNOSIS — Z1231 Encounter for screening mammogram for malignant neoplasm of breast: Secondary | ICD-10-CM

## 2020-12-16 ENCOUNTER — Ambulatory Visit
Admission: RE | Admit: 2020-12-16 | Discharge: 2020-12-16 | Disposition: A | Discharge status: Home / Self Care | Payer: 59 | Source: Ambulatory Visit

## 2020-12-16 DIAGNOSIS — Z1231 Encounter for screening mammogram for malignant neoplasm of breast: Secondary | ICD-10-CM

## 2020-12-18 ENCOUNTER — Encounter: Payer: Self-pay | Admitting: Hematology and Oncology

## 2020-12-21 ENCOUNTER — Other Ambulatory Visit: Payer: Self-pay | Admitting: Hematology and Oncology

## 2020-12-21 DIAGNOSIS — R928 Other abnormal and inconclusive findings on diagnostic imaging of breast: Secondary | ICD-10-CM

## 2021-01-08 ENCOUNTER — Other Ambulatory Visit: Payer: Self-pay

## 2021-01-08 ENCOUNTER — Ambulatory Visit
Admission: RE | Admit: 2021-01-08 | Discharge: 2021-01-08 | Disposition: A | Discharge status: Home / Self Care | Payer: 59 | Source: Ambulatory Visit | Attending: Hematology and Oncology | Admitting: Hematology and Oncology

## 2021-01-08 DIAGNOSIS — R928 Other abnormal and inconclusive findings on diagnostic imaging of breast: Secondary | ICD-10-CM

## 2021-01-08 NOTE — Progress Notes (Signed)
Please inform the patient that the mammogram results look good. No evidence of cancer.

## 2021-04-24 ENCOUNTER — Other Ambulatory Visit: Payer: Self-pay | Admitting: Gastroenterology

## 2021-05-24 ENCOUNTER — Other Ambulatory Visit: Payer: Self-pay

## 2021-05-24 ENCOUNTER — Encounter: Payer: Self-pay | Admitting: Plastic Surgery

## 2021-05-24 ENCOUNTER — Ambulatory Visit (INDEPENDENT_AMBULATORY_CARE_PROVIDER_SITE_OTHER): Payer: Self-pay | Admitting: Plastic Surgery

## 2021-05-24 DIAGNOSIS — Z719 Counseling, unspecified: Secondary | ICD-10-CM

## 2021-05-24 NOTE — Progress Notes (Signed)
Botulinum Toxin Procedure Note  Procedure: Cosmetic botulinum toxin   Pre-operative Diagnosis: Dynamic rhytides  Post-operative Diagnosis: Same  Complications:  None  Brief history: The patient desires botulinum toxin injection of her forehead. I discussed with the patient this proposed procedure of botulinum toxin injections, which is customized depending on the particular needs of the patient. It is performed on facial rhytids as a temporary correction. The alternatives were discussed with the patient. The risks were addressed including bleeding, scarring, infection, damage to deeper structures, asymmetry, and chronic pain, which may occur infrequently after a procedure. The individual's choice to undergo a surgical procedure is based on the comparison of risks to potential benefits. Other risks include unsatisfactory results, brow ptosis, eyelid ptosis, allergic reaction, temporary paralysis, which should go away with time, bruising, blurring disturbances and delayed healing. Botulinum toxin injections do not arrest the aging process or produce permanent tightening of the eyelid.  Operative intervention maybe necessary to maintain the results of a blepharoplasty or botulinum toxin. The patient understands and wishes to proceed. An informed consent was signed and informational brochures given to her prior to the procedure.  Procedure: The area was prepped with alcohol and dried with a clean gauze. Using a clean technique, the botulinum toxin was diluted with 1.25 cc of preservative-free normal saline which was slowly injected with an 18 gauge needle in a tuberculin syringes.  A 32 gauge needles were then used to inject the botulinum toxin. This mixture allow for an aliquot of 10 units per 0.1 cc in each injection site.    Subsequently the mixture was injected in the glabellar and forehead area with preservation of the temporal branch to the lateral eyebrow as well as into each lateral canthal area  beginning from the lateral orbital rim medial to the zygomaticus major in 3 separate areas. A total of 90 Units of botulinum toxin was used. The forehead and glabellar area was injected with care to inject intramuscular only while holding pressure on the supratrochlear vessels in each area during each injection on either side of the medial corrugators. The injection proceeded vertically superiorly to the medial 2/3 of the frontalis muscle and superior 2/3 of the lateral frontalis, again with preservation of the frontal branch.  No complications were noted. Light pressure was held for 5 minutes. She was instructed explicitly in post-operative care.  Dysport LOT:  [id number] EXP:  10/12/21

## 2021-06-30 ENCOUNTER — Other Ambulatory Visit: Payer: Self-pay

## 2021-06-30 ENCOUNTER — Ambulatory Visit (HOSPITAL_COMMUNITY)
Admission: RE | Admit: 2021-06-30 | Discharge: 2021-06-30 | Disposition: A | Discharge status: Home / Self Care | Payer: 59 | Source: Ambulatory Visit | Attending: Hematology and Oncology | Admitting: Hematology and Oncology

## 2021-06-30 DIAGNOSIS — Z9189 Other specified personal risk factors, not elsewhere classified: Secondary | ICD-10-CM | POA: Diagnosis not present

## 2021-06-30 MED ORDER — GADOBUTROL 1 MMOL/ML IV SOLN
9.5000 mL | Freq: Once | INTRAVENOUS | Status: AC | PRN
  Administered 2021-06-30: 9.5 mL via INTRAVENOUS

## 2021-07-01 ENCOUNTER — Other Ambulatory Visit: Payer: Self-pay | Admitting: Hematology and Oncology

## 2021-07-01 DIAGNOSIS — R9389 Abnormal findings on diagnostic imaging of other specified body structures: Secondary | ICD-10-CM

## 2021-07-06 ENCOUNTER — Telehealth: Payer: Self-pay | Admitting: *Deleted

## 2021-07-06 NOTE — Telephone Encounter (Signed)
Per Dr.Feng, OK to add Dr.Iruku pt to her schedule to f/u after scheduled biopsy on 11/4. Dr.Iruku recommended pt be seen 10-14 days after biopsy results.

## 2021-07-16 ENCOUNTER — Ambulatory Visit
Admission: RE | Admit: 2021-07-16 | Discharge: 2021-07-16 | Disposition: A | Discharge status: Home / Self Care | Payer: 59 | Source: Ambulatory Visit | Attending: Hematology and Oncology | Admitting: Hematology and Oncology

## 2021-07-16 ENCOUNTER — Other Ambulatory Visit: Payer: Self-pay

## 2021-07-16 DIAGNOSIS — R9389 Abnormal findings on diagnostic imaging of other specified body structures: Secondary | ICD-10-CM

## 2021-07-20 ENCOUNTER — Other Ambulatory Visit: Payer: Self-pay | Admitting: Hematology and Oncology

## 2021-07-20 DIAGNOSIS — R9389 Abnormal findings on diagnostic imaging of other specified body structures: Secondary | ICD-10-CM

## 2021-07-28 ENCOUNTER — Ambulatory Visit
Admission: RE | Admit: 2021-07-28 | Discharge: 2021-07-28 | Disposition: A | Discharge status: Home / Self Care | Payer: 59 | Source: Ambulatory Visit | Attending: Hematology and Oncology | Admitting: Hematology and Oncology

## 2021-07-28 ENCOUNTER — Other Ambulatory Visit: Payer: Self-pay

## 2021-07-28 ENCOUNTER — Other Ambulatory Visit (HOSPITAL_COMMUNITY): Payer: Self-pay | Admitting: Diagnostic Radiology

## 2021-07-28 DIAGNOSIS — R9389 Abnormal findings on diagnostic imaging of other specified body structures: Secondary | ICD-10-CM

## 2021-07-28 HISTORY — PX: BREAST BIOPSY: SHX20

## 2021-07-28 MED ORDER — GADOBUTROL 1 MMOL/ML IV SOLN
7.0000 mL | Freq: Once | INTRAVENOUS | Status: AC | PRN
  Administered 2021-07-28: 7 mL via INTRAVENOUS

## 2021-07-30 ENCOUNTER — Ambulatory Visit: Payer: 59 | Admitting: Hematology

## 2021-08-23 ENCOUNTER — Telehealth: Payer: Self-pay | Admitting: Hematology and Oncology

## 2021-08-23 NOTE — Telephone Encounter (Signed)
Scheduled per sch msg. Called and spoke with patient. Confirmed appt  

## 2021-09-07 NOTE — Progress Notes (Signed)
[facility] CONSULT NOTE  Patient Care Team: Roe Rutherford, NP as PCP - General (Adult Health Nurse Practitioner)  CHIEF COMPLAINTS/PURPOSE OF CONSULTATION:  Family history of breast cancer.  ASSESSMENT & PLAN:  No problem-specific Assessment & Plan notes found for this encounter.  No orders of the defined types were placed in this encounter.  1. High risk for breast cancer. discussed MRI alternating with mammograms for breast cancer screening. We have discussed that long term risk of gadolinium deposition from repeated MRI is not clear at this time. MRI's also can lead to increased biopsies. Last breast noted some abnormality, biopsied fibrocystic changes with apocrine metaplasia. Repeat MRI recommended in 6 months, April 2023. Screening mammogram due in April 2023 as well. Breast exam today unremarkable except for postbiopsy changes in the right breast, no concerns. I have ordered her next MRI as well as a mammogram.  We will try to proceed with MRI as recommended in April and will try to do the mammogram after her MRI.  She is agreeable to all the recommendations.  We have once again discussed that there is a small risk of breast cancer with the combination birth control.  However since this preserves her quality of life especially with the mood changes are normal, she is hopeful to continue it.  I have recommended self breast exam once a month, regular exercise, healthy diet and return to clinic for follow-up in May 2023 or sooner if needed.  HISTORY OF PRESENTING ILLNESS:   Beth Winters 45 y.o. female is here because for evaluation by High risk breast clinic.  This is a very pleasant 45 year old female patient with no significant past medical history except for family history of breast cancer referred to high risk breast cancer clinic given her lifetime risk of breast cancer.  She is here for follow-up.  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  Repeat MRI was recommended in about 6 months.  She denies any new breast changes.  She initially had a large hematoma in the right breast after biopsy which has been slowly subsiding according to the patient.  No other health problems.  She restarted on the birth control pill because of PMDD.  She understands there is a small increased risk of breast cancer Rest of the pertinent 10 point ROS reviewed and negative.  REVIEW OF SYSTEMS:   Constitutional: Denies fevers, chills or abnormal night sweats Eyes: Denies blurriness of vision, double vision or watery eyes Ears, nose, mouth, throat, and face: Denies mucositis or sore throat Respiratory: Denies cough, dyspnea or wheezes Cardiovascular: Denies palpitation, chest discomfort or lower extremity swelling Gastrointestinal:  Denies nausea, heartburn or change in bowel habits Skin: Denies abnormal skin rashes Lymphatics: Denies new lymphadenopathy or easy bruising Neurological:Denies numbness, tingling or new weaknesses Behavioral/Psych: Mood is stable, no new changes  All other systems were reviewed with the patient and are negative.  MEDICAL HISTORY:  Past Medical History:  Diagnosis Date   Family history of adrenal cancer    Family history of breast cancer    Family history of colon cancer    Family history of stomach cancer    GERD (gastroesophageal reflux disease)     SURGICAL HISTORY: Past Surgical History:  Procedure Laterality Date   BREAST BIOPSY Right 07/28/2021   LYMPH NODE DISSECTION  1993   bx's done first-enlarged so they removed this.   TONSILLECTOMY     WISDOM  TOOTH EXTRACTION      SOCIAL HISTORY: Social History   Socioeconomic History   Marital status: Married    Spouse name: Not on file   Number of children: Not on file   Years of education: Not on file   Highest education level: Not on file  Occupational History   Not on file  Tobacco Use    Smoking status: Never   Smokeless tobacco: Never  Vaping Use   Vaping Use: Never used  Substance and Sexual Activity   Alcohol use: Yes    Comment: 2-3 drinks per month per pt   Drug use: No   Sexual activity: Yes    Partners: Male    Birth control/protection: Pill  Other Topics Concern   Not on file  Social History Narrative   Not on file   Social Determinants of Health   Financial Resource Strain: Not on file  Food Insecurity: Not on file  Transportation Needs: Not on file  Physical Activity: Not on file  Stress: Not on file  Social Connections: Not on file  Intimate Partner Violence: Not on file    FAMILY HISTORY: Family History  Problem Relation Age of Onset   Stomach cancer Father 54   Breast cancer Paternal Aunt 55   Colon cancer Paternal Aunt 61   AAA (abdominal aortic aneurysm) Paternal Aunt    Other Paternal Aunt 1       adrenal cancer   Esophageal cancer Paternal Uncle        or lung cancer   Hypertrophic cardiomyopathy Mother    Hypertrophic cardiomyopathy Sister    Hypertrophic cardiomyopathy Maternal Uncle    Colon polyps Neg Hx     ALLERGIES:  is allergic to other and pineapple.  MEDICATIONS:  Current Outpatient Medications  Medication Sig Dispense Refill   Ascorbic Acid (VITAMIN C PO) Take by mouth.     buPROPion (WELLBUTRIN XL) 300 MG 24 hr tablet Take 300 mg by mouth daily.     cetirizine (ZYRTEC) 5 MG tablet Take 5 mg by mouth daily.     glycopyrrolate (ROBINUL) 1 MG tablet Take 1 tablet (1 mg total) by mouth 3 (three) times daily as needed (intestinal cramps and gas pain). 90 tablet 0   omeprazole (PRILOSEC) 40 MG capsule TAKE 1 CAPSULE BY MOUTH EVERY DAY 90 capsule 3   TRI-SPRINTEC 0.18/0.215/0.25 MG-35 MCG tablet Take 1 tablet by mouth daily.     VITAMIN D PO Take by mouth.     Current Facility-Administered Medications  Medication Dose Route Frequency Provider Last Rate Last Admin   0.9 %  sodium chloride infusion  500 mL Intravenous  Once Nandigam, Venia Minks, MD         PHYSICAL EXAMINATION:  ECOG PERFORMANCE STATUS: 0 - Asymptomatic  There were no vitals filed for this visit.  There were no vitals filed for this visit.   GENERAL:alert, no distress and comfortable NECK: supple, thyroid normal size, non-tender, without nodularity LYMPH:  no palpable lymphadenopathy in the cervical, axillary  Breast exam: Bilateral breasts inspected.  No definitive palpable masses, postbiopsy changes in the right breast which are continue to improve according to the patient.  No regional adenopathy.  No nipple changes noted  LABORATORY DATA:  I have reviewed the data as listed No results found for: WBC, HGB, HCT, MCV, PLT   Chemistry   No results found for: NA, K, CL, CO2, BUN, CREATININE, GLU No results found for: CALCIUM, ALKPHOS, AST, ALT, BILITOT  RADIOGRAPHIC STUDIES: I have personally reviewed the radiological images as listed and agreed with the findings in the report. No results found.  All questions were answered. The patient knows to call the clinic with any problems, questions or concerns. I spent 30 minutes in the care of this patient reviewing her history, image results, pathology reports, physical examination, discussion about follow-up recommendations and surveillance recommendations.    Benay Pike, MD 09/07/2021 9:04 PM

## 2021-09-08 ENCOUNTER — Inpatient Hospital Stay: Payer: 59

## 2021-09-08 ENCOUNTER — Inpatient Hospital Stay: Payer: 59 | Attending: Hematology and Oncology | Admitting: Hematology and Oncology

## 2021-09-08 ENCOUNTER — Other Ambulatory Visit: Payer: Self-pay

## 2021-09-08 ENCOUNTER — Encounter: Payer: Self-pay | Admitting: Hematology and Oncology

## 2021-09-08 VITALS — BP 109/71 | HR 92 | Temp 98.9°F | Resp 18 | Wt 200.5 lb

## 2021-09-08 DIAGNOSIS — Z9189 Other specified personal risk factors, not elsewhere classified: Secondary | ICD-10-CM | POA: Diagnosis not present

## 2021-09-08 DIAGNOSIS — Z803 Family history of malignant neoplasm of breast: Secondary | ICD-10-CM | POA: Insufficient documentation

## 2021-09-08 DIAGNOSIS — Z1501 Genetic susceptibility to malignant neoplasm of breast: Secondary | ICD-10-CM | POA: Insufficient documentation

## 2021-09-09 ENCOUNTER — Telehealth: Payer: Self-pay | Admitting: Hematology and Oncology

## 2021-09-09 NOTE — Telephone Encounter (Signed)
Scheduled appointment per 12/28 los. Left message.  °

## 2021-12-17 ENCOUNTER — Ambulatory Visit
Admission: RE | Admit: 2021-12-17 | Discharge: 2021-12-17 | Disposition: A | Discharge status: Home / Self Care | Payer: 59 | Source: Ambulatory Visit | Attending: Hematology and Oncology | Admitting: Hematology and Oncology

## 2021-12-17 DIAGNOSIS — Z9189 Other specified personal risk factors, not elsewhere classified: Secondary | ICD-10-CM

## 2022-01-20 ENCOUNTER — Inpatient Hospital Stay: Payer: 59 | Admitting: Hematology and Oncology

## 2022-02-20 ENCOUNTER — Ambulatory Visit
Admission: RE | Admit: 2022-02-20 | Discharge: 2022-02-20 | Disposition: A | Discharge status: Home / Self Care | Payer: 59 | Source: Ambulatory Visit | Attending: Hematology and Oncology | Admitting: Hematology and Oncology

## 2022-02-20 DIAGNOSIS — Z803 Family history of malignant neoplasm of breast: Secondary | ICD-10-CM

## 2022-02-20 MED ORDER — GADOBUTROL 1 MMOL/ML IV SOLN
9.0000 mL | Freq: Once | INTRAVENOUS | Status: AC | PRN
  Administered 2022-02-20: 9 mL via INTRAVENOUS

## 2022-02-28 ENCOUNTER — Other Ambulatory Visit: Payer: Self-pay | Admitting: Hematology and Oncology

## 2022-02-28 DIAGNOSIS — Z9189 Other specified personal risk factors, not elsewhere classified: Secondary | ICD-10-CM

## 2022-02-28 NOTE — Progress Notes (Signed)
Repeat breast MRI ordered per radiology recommendations.

## 2022-08-23 ENCOUNTER — Ambulatory Visit
Admission: RE | Admit: 2022-08-23 | Discharge: 2022-08-23 | Disposition: A | Discharge status: Home / Self Care | Payer: 59 | Source: Ambulatory Visit | Attending: Hematology and Oncology | Admitting: Hematology and Oncology

## 2022-08-23 DIAGNOSIS — Z9189 Other specified personal risk factors, not elsewhere classified: Secondary | ICD-10-CM

## 2022-08-23 MED ORDER — GADOPICLENOL 0.5 MMOL/ML IV SOLN
9.0000 mL | Freq: Once | INTRAVENOUS | Status: AC | PRN
  Administered 2022-08-23: 9 mL via INTRAVENOUS

## 2022-08-30 ENCOUNTER — Other Ambulatory Visit: Payer: Self-pay | Admitting: Hematology and Oncology

## 2022-08-30 DIAGNOSIS — Z9189 Other specified personal risk factors, not elsewhere classified: Secondary | ICD-10-CM

## 2022-08-30 NOTE — Progress Notes (Signed)
Mammogram ordered for April. MRI anticipated Dec 2024. Ordered.

## 2022-12-20 ENCOUNTER — Ambulatory Visit
Admission: RE | Admit: 2022-12-20 | Discharge: 2022-12-20 | Disposition: A | Discharge status: Home / Self Care | Payer: 59 | Source: Ambulatory Visit | Attending: Hematology and Oncology | Admitting: Hematology and Oncology

## 2022-12-20 DIAGNOSIS — Z9189 Other specified personal risk factors, not elsewhere classified: Secondary | ICD-10-CM

## 2023-12-08 ENCOUNTER — Other Ambulatory Visit: Payer: Self-pay | Admitting: Adult Health Nurse Practitioner

## 2023-12-08 DIAGNOSIS — Z0000 Encounter for general adult medical examination without abnormal findings: Secondary | ICD-10-CM

## 2023-12-22 ENCOUNTER — Ambulatory Visit
Admission: RE | Admit: 2023-12-22 | Discharge: 2023-12-22 | Disposition: A | Discharge status: Home / Self Care | Source: Ambulatory Visit | Attending: Adult Health Nurse Practitioner | Admitting: Adult Health Nurse Practitioner

## 2023-12-22 DIAGNOSIS — Z0000 Encounter for general adult medical examination without abnormal findings: Secondary | ICD-10-CM

## 2024-04-18 IMAGING — MR MR BREAST BILAT WO/W CM
8 of 12 series · 33 of 48 positions shown · IV contrast (9 ML GADAVIST)
Comparison: With priors.

CLINICAL DATA: Patient had an MR guided core biopsy of the right
breast on 07/28/2021 demonstrating fibrocystic changes including
apocrine metaplasia. Pathology was found to be concordant. Follow-up
MRI recommended. Lifetime risk for breast cancer calculated greater
than 30%.

EXAM:
BILATERAL BREAST MRI WITH AND WITHOUT CONTRAST
TECHNIQUE: Multiplanar, multisequence MR images of both breasts were obtained
prior to and following the intravenous administration of 9 ml of
Gadavist

[Series 2: t2_tirm_tra ipat (a-p) · axial · 3.0mm · 0.70mm/px · 1 of 55 slices shown]
[im 1/55]
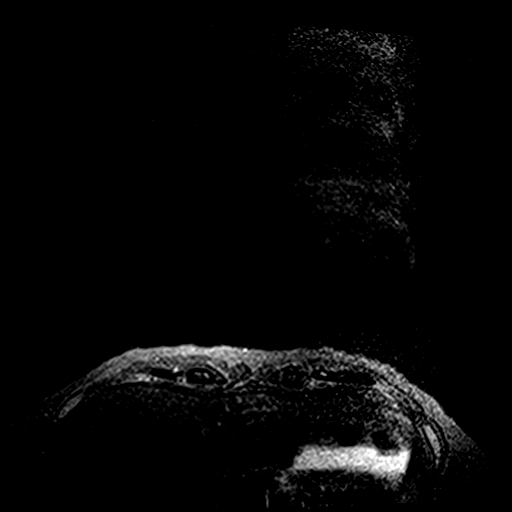

[Series 3: fl3d pre-cm no · axial · non-contrast · 1.2mm · 0.89mm/px · z∈[-86,+86]mm · 5 of 144 slices shown]
[im 1/144]
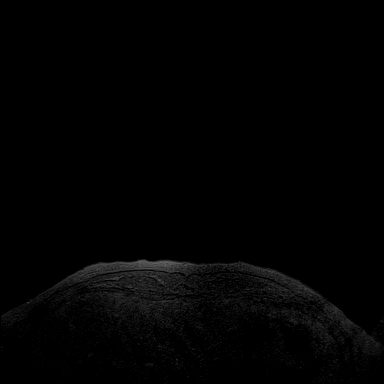
[im 36/144]
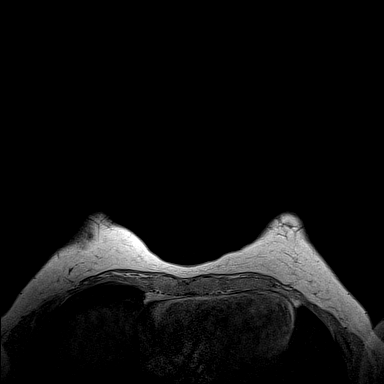
[im 72/144]
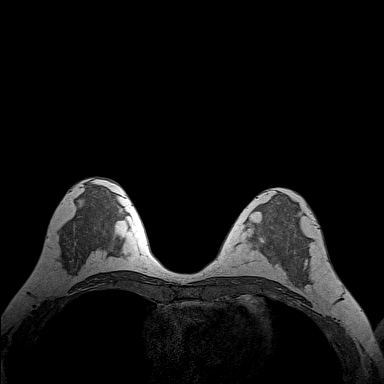
[im 108/144]
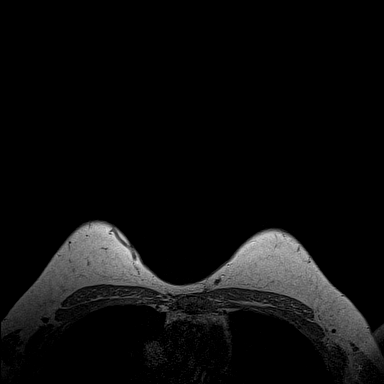
[im 144/144]
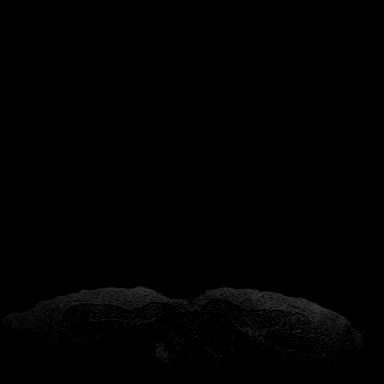

[Series 4: fl3d pre-cm · axial · non-contrast · 1.2mm · 0.89mm/px · z∈[-86,+86]mm · 5 of 144 slices shown]
[im 1/144]
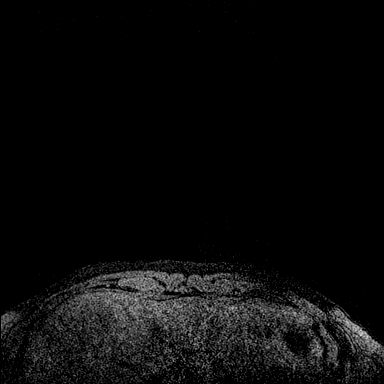
[im 36/144]
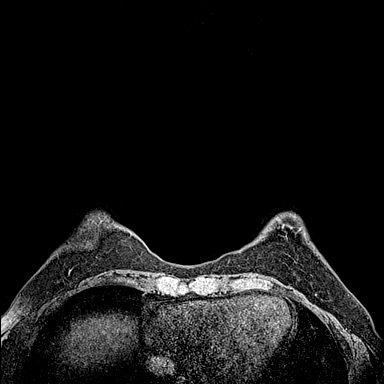
[im 72/144]
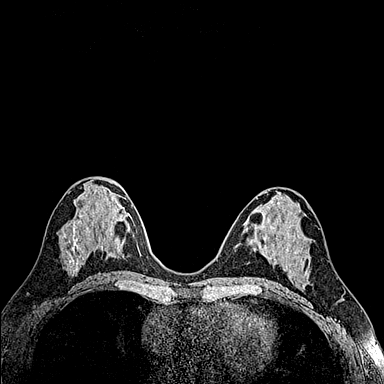
[im 108/144]
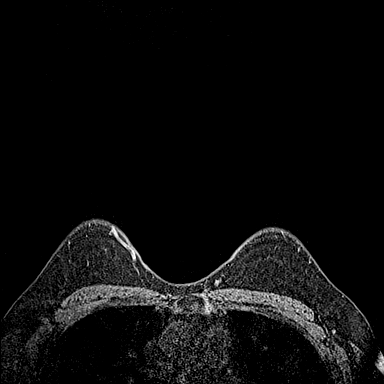
[im 144/144]
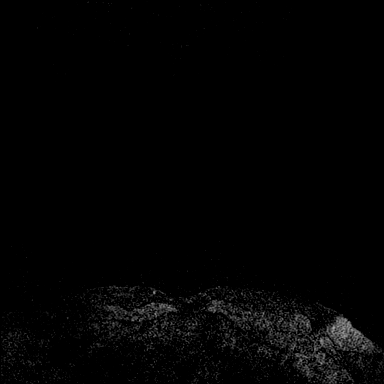

[Series 5: fl3d post-cm 20 · axial · 1.2mm · 0.89mm/px · z∈[-86,+86]mm · 5 of 144 slices shown (1 of 3)]
[im 1/144]
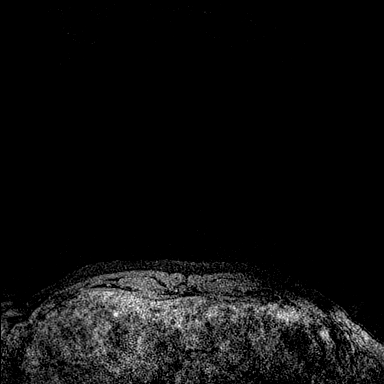
[im 36/144]
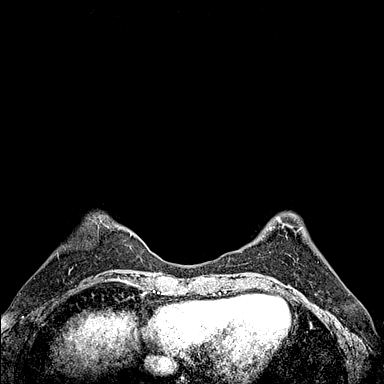
[im 72/144]
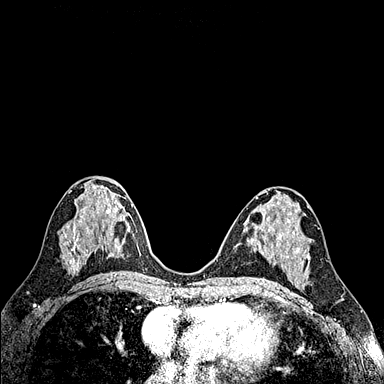
[im 108/144]
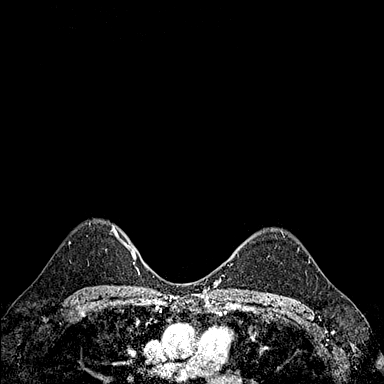
[im 144/144]
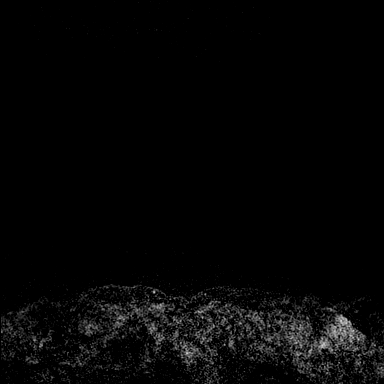

[Series 6: fl3d post-cm 20 · axial · 1.2mm · 0.89mm/px · z∈[-86,+86]mm · 5 of 144 slices shown (2 of 3)]
[im 1/144]
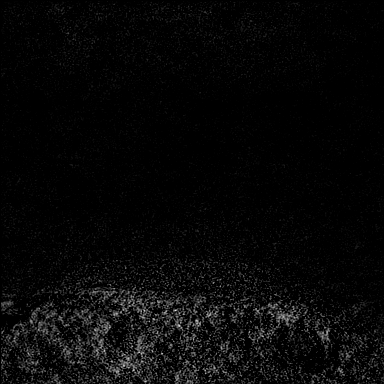
[im 36/144]
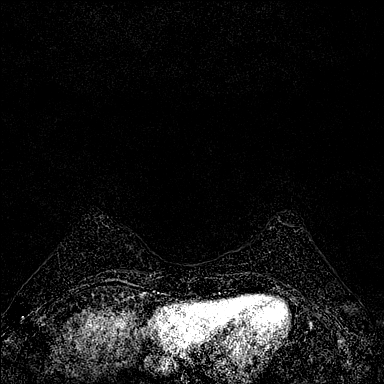
[im 72/144]
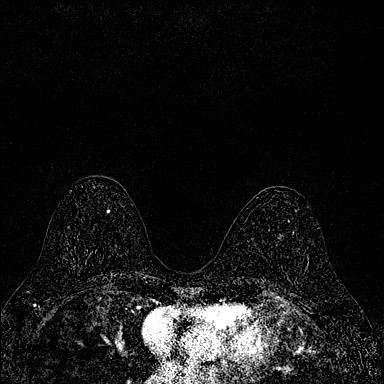
[im 108/144]
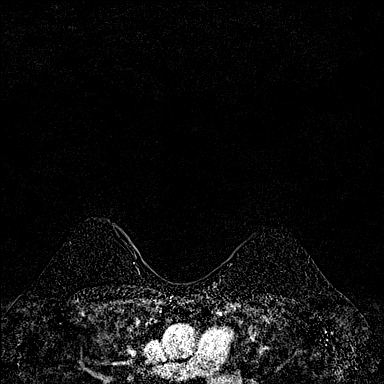
[im 144/144]
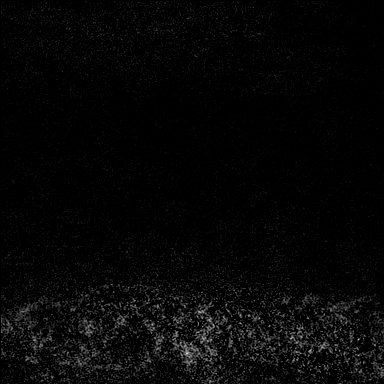

[Series 7: fl3d post-cm 20 · axial · 172.8mm · 0.89mm/px · 1 of 1 slices shown (3 of 3)]
[im 1/1]
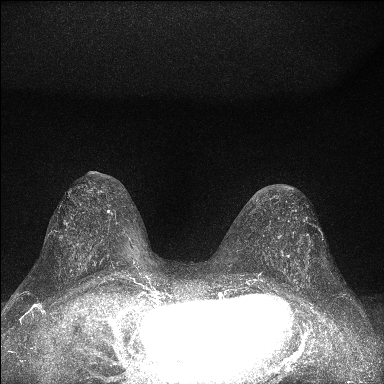

[Series 8: fl3d post-cm 3 · axial · 1.2mm · 0.89mm/px · z∈[-86,+86]mm · 6 of 144 slices shown (1 of 2)]
[im 1/144]
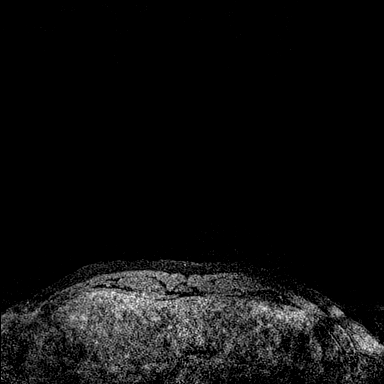
[im 29/144]
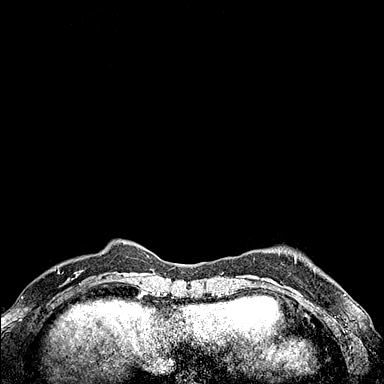
[im 58/144]
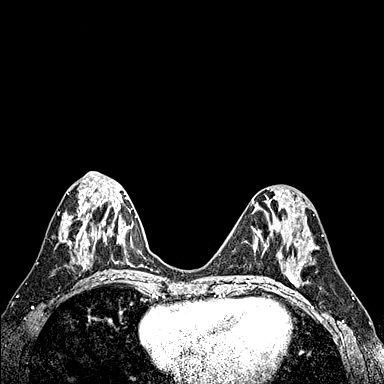
[im 86/144]
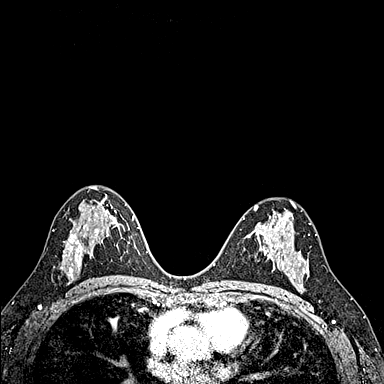
[im 115/144]
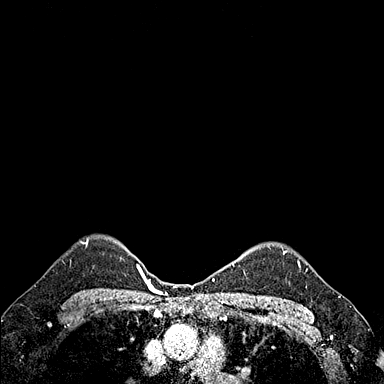
[im 144/144]
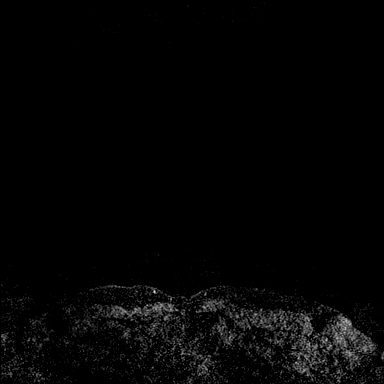

[Series 9: fl3d post-cm 3 · axial · 1.2mm · 0.89mm/px · z∈[-86,+51]mm · 5 of 144 slices shown (2 of 2)]
[im 1/144]
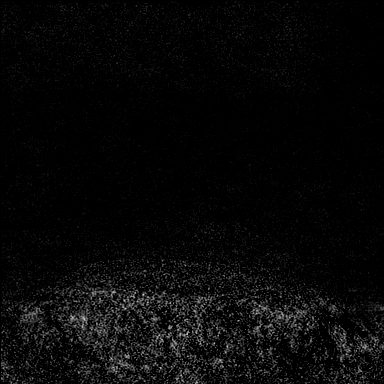
[im 29/144]
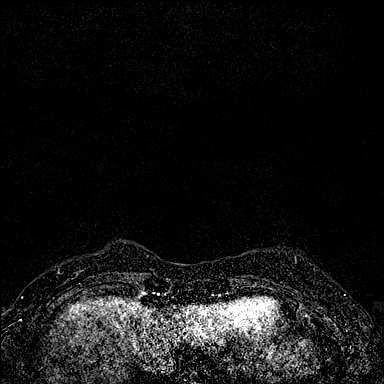
[im 58/144]
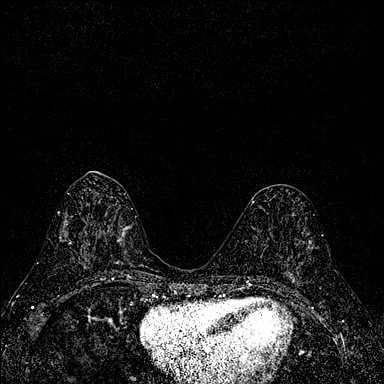
[im 86/144]
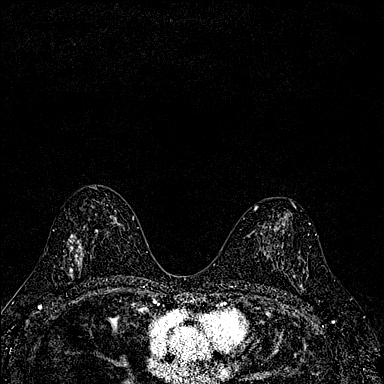
[im 115/144]
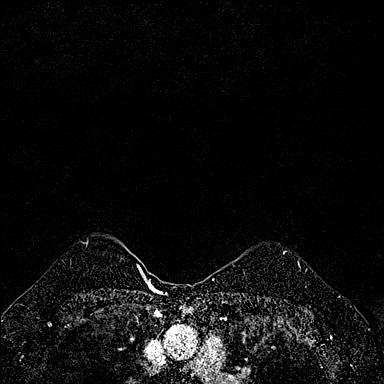

[33 of 48 positions shown; findings below may reference images not displayed]

Three-dimensional MR images were rendered by post-processing of the
original MR data on an independent workstation. The
three-dimensional MR images were interpreted, and findings are
reported in the following complete MRI report for this study. Three
dimensional images were evaluated at the independent interpreting
workstation using the DynaCAD thin client.
FINDINGS: Breast composition: d. Extreme fibroglandular tissue.

Background parenchymal enhancement: Moderate.

Right breast: There is a stable appearing 5 mm enhancing mass
(series 9, image 83) in the outer right breast. This was biopsied on
07/28/2021. Adjacent to the mass is signal void artifact from the
biopsy clip. No suspicious enhancing mass is seen in the right
breast.

Left breast: No mass or abnormal enhancement.

Lymph nodes: No abnormal appearing lymph nodes.

Ancillary findings:  None.
IMPRESSION: Stable appearance of the enhancing mass in the outer aspect of the
right breast. No additional areas of abnormal enhancement in either
breast.

RECOMMENDATION:
Bilateral breast MRI in 6 months is recommended to document
stability of the enhancing mass in the right breast for 1 year.

Bilateral screening mammogram in Monday December, 2022 is recommended.

BI-RADS CATEGORY  2: Benign.

## 2024-05-31 ENCOUNTER — Other Ambulatory Visit: Payer: Self-pay | Admitting: Nurse Practitioner

## 2024-05-31 DIAGNOSIS — R92343 Mammographic extreme density, bilateral breasts: Secondary | ICD-10-CM

## 2024-05-31 DIAGNOSIS — Z9189 Other specified personal risk factors, not elsewhere classified: Secondary | ICD-10-CM

## 2024-05-31 DIAGNOSIS — Z803 Family history of malignant neoplasm of breast: Secondary | ICD-10-CM

## 2024-06-27 ENCOUNTER — Encounter: Payer: Self-pay | Admitting: Nurse Practitioner

## 2024-07-10 ENCOUNTER — Other Ambulatory Visit

## 2024-08-17 ENCOUNTER — Ambulatory Visit
Admission: RE | Admit: 2024-08-17 | Discharge: 2024-08-17 | Disposition: A | Source: Ambulatory Visit | Attending: Nurse Practitioner | Admitting: Nurse Practitioner

## 2024-08-17 DIAGNOSIS — Z803 Family history of malignant neoplasm of breast: Secondary | ICD-10-CM

## 2024-08-17 DIAGNOSIS — Z9189 Other specified personal risk factors, not elsewhere classified: Secondary | ICD-10-CM

## 2024-08-17 DIAGNOSIS — R92343 Mammographic extreme density, bilateral breasts: Secondary | ICD-10-CM

## 2024-08-17 MED ORDER — GADOPICLENOL 0.5 MMOL/ML IV SOLN
8.0000 mL | Freq: Once | INTRAVENOUS | Status: AC | PRN
  Administered 2024-08-17: 8 mL via INTRAVENOUS
